# Patient Record
Sex: Female | Born: 1970
Health system: Southern US, Community
[De-identification: ages and names within clinical notes are randomized; demographics above are authoritative.]

## PROBLEM LIST (undated history)

## (undated) DIAGNOSIS — Z87440 Personal history of urinary (tract) infections: Secondary | ICD-10-CM

## (undated) DIAGNOSIS — O99019 Anemia complicating pregnancy, unspecified trimester: Secondary | ICD-10-CM

## (undated) DIAGNOSIS — R1310 Dysphagia, unspecified: Secondary | ICD-10-CM

## (undated) DIAGNOSIS — K219 Gastro-esophageal reflux disease without esophagitis: Secondary | ICD-10-CM

## (undated) DIAGNOSIS — O169 Unspecified maternal hypertension, unspecified trimester: Secondary | ICD-10-CM

## (undated) HISTORY — DX: Unspecified maternal hypertension, unspecified trimester: O16.9

## (undated) HISTORY — PX: DILATION AND CURETTAGE OF UTERUS: SHX78

## (undated) HISTORY — DX: Personal history of urinary (tract) infections: Z87.440

## (undated) HISTORY — DX: Dysphagia, unspecified: R13.10

## (undated) HISTORY — DX: Anemia complicating pregnancy, unspecified trimester: O99.019

## (undated) HISTORY — PX: OTHER SURGICAL HISTORY: SHX169

## (undated) HISTORY — PX: ESOPHAGOGASTRODUODENOSCOPY ENDOSCOPY: SHX5814

## (undated) HISTORY — DX: Gastro-esophageal reflux disease without esophagitis: K21.9

---

## 2010-06-10 ENCOUNTER — Encounter: Payer: Self-pay | Admitting: Gastroenterology

## 2010-06-10 ENCOUNTER — Ambulatory Visit: Payer: Self-pay | Admitting: Gastroenterology

## 2010-06-10 ENCOUNTER — Ambulatory Visit (INDEPENDENT_AMBULATORY_CARE_PROVIDER_SITE_OTHER): Payer: 59 | Admitting: Gastroenterology

## 2010-06-10 VITALS — BP 122/74 | HR 68 | Ht 62.0 in | Wt 138.0 lb

## 2010-06-10 DIAGNOSIS — R1319 Other dysphagia: Secondary | ICD-10-CM

## 2010-06-10 NOTE — Patient Instructions (Signed)
You have been scheduled for a Barium Esophagram with tablet on 06/11/10 at 11:00am at [facility] Radiology department.  You have also been scheduled for a Upper Endoscopy with Savary.

## 2010-06-10 NOTE — Progress Notes (Signed)
History of Present Illness: This is a 40 year old female, wife of Dr. Hillis Range. She relates an 8 year history of intermittent solid food dysphagia he states symptoms occur infrequently, possibly every 2-3 months and she would occasionally have to vomit to relieve the obstruction. She has noted a slow and steady increase in the frequency of her dysphagia and now her symptoms occur approximately every other day and she induces vomiting every few weeks. She only notes problems with solids. No liquid dysphagia. She states she had mild heartburn during her pregnancies, but other than that has not had heartburn symptoms. She has no other gastrointestinal complaints. She denies painful swallowing, reflux symptoms, chest pain, nausea, vomiting, abdominal pain, melena, hematochezia, diarrhea, constipation, change in bowel habits or change in stool caliber.  Past Medical History  Diagnosis Date  . History of UTI   . Hypertension in pregnancy   . Anemia in pregnancy    Past Surgical History  Procedure Date  . Dilation and curettage of uterus     reports that she has never smoked. She has never used smokeless tobacco. She reports that she does not drink alcohol or use illicit drugs. family history includes Diabetes in her father and Kidney disease in her maternal grandfather and paternal grandfather.  There is no history of Colon cancer. No Known Allergies    No outpatient encounter prescriptions on file as of 06/10/2010.   Review of Systems: Pertinent positive and negative review of systems were noted in the above HPI section. All other review of systems were otherwise negative.  Physical Exam: General: Well developed , well nourished, no acute distress Head: Normocephalic and atraumatic Eyes:  sclerae anicteric, EOMI Ears: Normal auditory acuity Mouth: No deformity or lesions Neck: Supple, no masses or thyromegaly Lungs: Clear throughout to auscultation Heart: Regular rate and rhythm; no  murmurs, rubs or bruits Abdomen: Soft, non tender and non distended. No masses, hepatosplenomegaly or hernias noted. Normal Bowel sounds Musculoskeletal: Symmetrical with no gross deformities  Skin: No lesions on visible extremities Pulses:  Normal pulses noted Extremities: No clubbing, cyanosis, edema or deformities noted Neurological: Alert oriented x 4, grossly nonfocal Cervical Nodes:  No significant cervical adenopathy Inguinal Nodes: No significant inguinal adenopathy Psychological:  Alert and cooperative. Normal mood and affect  Assessment and Recommendations:  1. Progressively worsening solid dysphagia. Symptoms present for 8 years but have worsened to the point of dysphagia occurring approximately every other day. Rule out a peptic stricture, Schatzki ring, eosinophilic esophagitis and other disorders. Schedule a barium esophagram with tablet and an upper endoscopy with possible dilation. The risks, benefits, and alternatives to endoscopy with possible biopsy and possible dilation were discussed with the patient and they consent to proceed.

## 2010-06-11 ENCOUNTER — Ambulatory Visit (HOSPITAL_COMMUNITY)
Admission: RE | Admit: 2010-06-11 | Discharge: 2010-06-11 | Disposition: A | Discharge status: Home / Self Care | Payer: 59 | Source: Ambulatory Visit | Attending: Gastroenterology | Admitting: Gastroenterology

## 2010-06-11 DIAGNOSIS — R1319 Other dysphagia: Secondary | ICD-10-CM

## 2010-06-11 DIAGNOSIS — R1310 Dysphagia, unspecified: Secondary | ICD-10-CM | POA: Insufficient documentation

## 2010-06-11 DIAGNOSIS — R6889 Other general symptoms and signs: Secondary | ICD-10-CM | POA: Insufficient documentation

## 2010-06-12 ENCOUNTER — Encounter: Payer: Self-pay | Admitting: Internal Medicine

## 2010-06-15 ENCOUNTER — Ambulatory Visit (AMBULATORY_SURGERY_CENTER): Payer: 59 | Admitting: Gastroenterology

## 2010-06-15 ENCOUNTER — Telehealth: Payer: Self-pay

## 2010-06-15 ENCOUNTER — Encounter: Payer: Self-pay | Admitting: Gastroenterology

## 2010-06-15 VITALS — BP 136/79 | HR 79 | Temp 96.8°F | Resp 13 | Ht 62.0 in | Wt 137.0 lb

## 2010-06-15 DIAGNOSIS — R1310 Dysphagia, unspecified: Secondary | ICD-10-CM

## 2010-06-15 MED ORDER — SODIUM CHLORIDE 0.9 % IV SOLN: 500.0000 mL | INTRAVENOUS | Status: DC

## 2010-06-15 NOTE — Telephone Encounter (Signed)
Message copied by Darcey Nora on Mon Jun 15, 2010 10:53 AM ------      Message from: Claudette Head      Created: Fri Jun 12, 2010  6:33 PM       Normal study      EGD with dilation is next

## 2010-06-15 NOTE — Progress Notes (Signed)
See telephone note from 06/15/10 for further documentation

## 2010-06-15 NOTE — Telephone Encounter (Signed)
Patient had EGD done today.  She is aware of the results

## 2010-06-15 NOTE — Patient Instructions (Signed)
Your upper endoscopy was normal today.  Dr. Russella Dar did dilate your esophagus today, so you will follow the dilation diet today.  Copy of dilation diet given to your care partner.  If your difficulity swallowing dose not resolve, and the biopsies are not diagnostic, consider  esophageal manomerty.  Please call with any questions or concerns.

## 2010-06-15 NOTE — Telephone Encounter (Signed)
Left message for patient to call back  

## 2010-06-16 ENCOUNTER — Telehealth: Payer: Self-pay | Admitting: *Deleted

## 2010-06-16 NOTE — Telephone Encounter (Signed)
Left message for patient to call us if she needs anything.

## 2010-06-18 ENCOUNTER — Encounter: Payer: Self-pay | Admitting: Gastroenterology

## 2015-06-11 DIAGNOSIS — Z0000 Encounter for general adult medical examination without abnormal findings: Secondary | ICD-10-CM | POA: Diagnosis not present

## 2015-06-11 DIAGNOSIS — E784 Other hyperlipidemia: Secondary | ICD-10-CM | POA: Diagnosis not present

## 2015-06-13 DIAGNOSIS — I10 Essential (primary) hypertension: Secondary | ICD-10-CM | POA: Diagnosis not present

## 2015-06-13 DIAGNOSIS — Z0000 Encounter for general adult medical examination without abnormal findings: Secondary | ICD-10-CM | POA: Diagnosis not present

## 2015-06-13 DIAGNOSIS — R740 Nonspecific elevation of levels of transaminase and lactic acid dehydrogenase [LDH]: Secondary | ICD-10-CM | POA: Diagnosis not present

## 2015-06-13 DIAGNOSIS — Z8719 Personal history of other diseases of the digestive system: Secondary | ICD-10-CM | POA: Diagnosis not present

## 2015-06-13 DIAGNOSIS — R635 Abnormal weight gain: Secondary | ICD-10-CM | POA: Diagnosis not present

## 2015-06-13 DIAGNOSIS — K219 Gastro-esophageal reflux disease without esophagitis: Secondary | ICD-10-CM | POA: Diagnosis not present

## 2015-06-13 DIAGNOSIS — E28319 Asymptomatic premature menopause: Secondary | ICD-10-CM | POA: Diagnosis not present

## 2015-06-13 DIAGNOSIS — G43909 Migraine, unspecified, not intractable, without status migrainosus: Secondary | ICD-10-CM | POA: Diagnosis not present

## 2015-06-13 DIAGNOSIS — E784 Other hyperlipidemia: Secondary | ICD-10-CM | POA: Diagnosis not present

## 2015-09-24 ENCOUNTER — Encounter: Payer: Self-pay | Admitting: Gastroenterology

## 2015-09-25 DIAGNOSIS — Z309 Encounter for contraceptive management, unspecified: Secondary | ICD-10-CM | POA: Diagnosis not present

## 2015-09-25 DIAGNOSIS — N924 Excessive bleeding in the premenopausal period: Secondary | ICD-10-CM | POA: Diagnosis not present

## 2015-11-27 ENCOUNTER — Ambulatory Visit: Payer: 59 | Admitting: Gastroenterology

## 2015-12-02 DIAGNOSIS — Z30430 Encounter for insertion of intrauterine contraceptive device: Secondary | ICD-10-CM | POA: Diagnosis not present

## 2015-12-02 DIAGNOSIS — Z6829 Body mass index (BMI) 29.0-29.9, adult: Secondary | ICD-10-CM | POA: Diagnosis not present

## 2015-12-02 DIAGNOSIS — R87810 Cervical high risk human papillomavirus (HPV) DNA test positive: Secondary | ICD-10-CM | POA: Diagnosis not present

## 2015-12-02 DIAGNOSIS — Z01419 Encounter for gynecological examination (general) (routine) without abnormal findings: Secondary | ICD-10-CM | POA: Diagnosis not present

## 2016-01-23 DIAGNOSIS — Z1231 Encounter for screening mammogram for malignant neoplasm of breast: Secondary | ICD-10-CM | POA: Diagnosis not present

## 2016-01-23 DIAGNOSIS — Z30431 Encounter for routine checking of intrauterine contraceptive device: Secondary | ICD-10-CM | POA: Diagnosis not present

## 2016-04-21 DIAGNOSIS — J00 Acute nasopharyngitis [common cold]: Secondary | ICD-10-CM | POA: Diagnosis not present

## 2016-04-21 DIAGNOSIS — B349 Viral infection, unspecified: Secondary | ICD-10-CM | POA: Diagnosis not present

## 2016-05-07 ENCOUNTER — Ambulatory Visit (INDEPENDENT_AMBULATORY_CARE_PROVIDER_SITE_OTHER): Payer: 59 | Admitting: Gastroenterology

## 2016-05-07 ENCOUNTER — Encounter: Payer: Self-pay | Admitting: Gastroenterology

## 2016-05-07 ENCOUNTER — Encounter (INDEPENDENT_AMBULATORY_CARE_PROVIDER_SITE_OTHER): Payer: Self-pay

## 2016-05-07 VITALS — BP 110/70 | HR 88 | Ht 61.61 in | Wt 156.4 lb

## 2016-05-07 DIAGNOSIS — R1310 Dysphagia, unspecified: Secondary | ICD-10-CM

## 2016-05-07 NOTE — Patient Instructions (Signed)
You have been scheduled for an endoscopy. Please follow written instructions given to you at your visit today. If you use inhalers (even only as needed), please bring them with you on the day of your procedure. Your physician has requested that you go to www.startemmi.com and enter the access code given to you at your visit today. This web site gives a general overview about your procedure. However, you should still follow specific instructions given to you by our office regarding your preparation for the procedure.  Thank you for choosing me and [facility].  Malcolm T. Stark, Jr., MD., FACG  

## 2016-05-07 NOTE — Progress Notes (Signed)
    History of Present Illness: This is a 46 year old female self referred for the evaluation of dysphagia. Patient relates mild intermittent problems with solid food dysphagia for about the past 3 years. The symptoms have slightly increased in frequency over the past 3-4 months. She previously underwent barium esophagram and EGD with empiric dilation as outlined below. She states following the EGD with dilation in her dysphasia abated for about 3 years. Denies weight loss, abdominal pain, constipation, diarrhea, change in stool caliber, melena, hematochezia, nausea, vomiting, reflux symptoms, chest pain.  Barium esophagram 05/2010 IMPRESSION: Normal double contrast esophagram. When the patient swallowed the 13 mm barium sulfate tablet, the tablet passed through the esophagus into the stomach without evidence of stricture or area of significant narrowing.  EGD 05/2010:  Normal with 17 mm Savary dilation performed. Esophageal biopsies were normal.   Review of Systems: Pertinent positive and negative review of systems were noted in the above HPI section. All other review of systems were otherwise negative.  Current Medications, Allergies, Past Medical History, Past Surgical History, Family History and Social History were reviewed in [employer] record.  Physical Exam: General: Well developed, well nourished, no acute distress Head: Normocephalic and atraumatic Eyes:  sclerae anicteric, EOMI Ears: Normal auditory acuity Mouth: No deformity or lesions Neck: Supple, no masses or thyromegaly Lungs: Clear throughout to auscultation Heart: Regular rate and rhythm; no murmurs, rubs or bruits Abdomen: Soft, non tender and non distended. No masses, hepatosplenomegaly or hernias noted. Normal Bowel sounds Rectal: not done Musculoskeletal: Symmetrical with no gross deformities  Skin: No lesions on visible extremities Pulses:  Normal pulses noted Extremities: No clubbing,  cyanosis, edema or deformities noted Neurological: Alert oriented x 4, grossly nonfocal Cervical Nodes:  No significant cervical adenopathy Inguinal Nodes: No significant inguinal adenopathy Psychological:  Alert and cooperative. Normal mood and affect  Assessment and Recommendations:  1. Dysphagia. Etiology unclear but suspect a subtle esophageal stricture or a motility disorder as most likely possibilities. Schedule EGD with repeat dilation. If symptoms do not respond will discuss esophageal manometry. The risks (including bleeding, perforation, infection, missed lesions, medication reactions and possible hospitalization or surgery if complications occur), benefits, and alternatives to endoscopy with possible biopsy and possible dilation were discussed with the patient and they consent to proceed.

## 2016-06-04 ENCOUNTER — Encounter: Payer: Self-pay | Admitting: Gastroenterology

## 2016-06-18 ENCOUNTER — Encounter: Payer: Self-pay | Admitting: Gastroenterology

## 2016-06-18 ENCOUNTER — Ambulatory Visit (AMBULATORY_SURGERY_CENTER): Payer: 59 | Admitting: Gastroenterology

## 2016-06-18 VITALS — BP 119/71 | HR 75 | Temp 98.9°F | Resp 15 | Ht 61.0 in | Wt 156.0 lb

## 2016-06-18 DIAGNOSIS — R1310 Dysphagia, unspecified: Secondary | ICD-10-CM | POA: Diagnosis not present

## 2016-06-18 DIAGNOSIS — K222 Esophageal obstruction: Secondary | ICD-10-CM

## 2016-06-18 DIAGNOSIS — K228 Other specified diseases of esophagus: Secondary | ICD-10-CM | POA: Diagnosis not present

## 2016-06-18 DIAGNOSIS — I10 Essential (primary) hypertension: Secondary | ICD-10-CM | POA: Diagnosis not present

## 2016-06-18 DIAGNOSIS — E669 Obesity, unspecified: Secondary | ICD-10-CM | POA: Diagnosis not present

## 2016-06-18 MED ORDER — SODIUM CHLORIDE 0.9 % IV SOLN: 500.0000 mL | INTRAVENOUS | Status: DC

## 2016-06-18 NOTE — Op Note (Signed)
[place] Patient Name: Phyllis Rogers Procedure Date: 06/18/2016 9:38 AM MRN: [id number] Endoscopist: Ladene Artist , MD Age: 46 Referring MD:  Date of Birth: 1970-02-27 Gender: Female Account #: [id number] Procedure:                Upper GI endoscopy Indications:              Dysphagia Medicines:                Monitored Anesthesia Care Procedure:                Pre-Anesthesia Assessment:                           - Prior to the procedure, a History and Physical                            was performed, and patient medications and                            allergies were reviewed. The patient's tolerance of                            previous anesthesia was also reviewed. The risks                            and benefits of the procedure and the sedation                            options and risks were discussed with the patient.                            All questions were answered, and informed consent                            was obtained. Prior Anticoagulants: The patient has                            taken no previous anticoagulant or antiplatelet                            agents. ASA Grade Assessment: II - A patient with                            mild systemic disease. After reviewing the risks                            and benefits, the patient was deemed in                            satisfactory condition to undergo the procedure.                           After obtaining informed consent, the endoscope was  passed under direct vision. Throughout the                            procedure, the patient's blood pressure, pulse, and                            oxygen saturations were monitored continuously. The                            Endoscope was introduced through the mouth, and                            advanced to the second part of duodenum. The upper                            GI endoscopy was accomplished without  difficulty.                            The patient tolerated the procedure well. Scope In: Scope Out: Findings:                 One moderate benign-appearing, intrinsic stenosis                            was found at the gastroesophageal junction. This                            measured 1.3 cm (inner diameter) and was traversed.                            A guidewire was placed and the scope was withdrawn.                            Dilation was performed with a Savary dilator with                            no resistance at 13 mm. Estimated blood loss: none.                            A guidewire was placed and the scope was withdrawn.                            Dilations were performed with a Savary dilator with                            mild resistance at 14 mm, 15 mm and 16 mm.                            Estimated blood loss was minimal.                           The exam of the esophagus was otherwise normal.  Biopsies obtained in mid and distal esophagus to                            r/o esophagitis.                           The entire examined stomach was normal.                           The duodenal bulb and second portion of the                            duodenum were normal. Complications:            No immediate complications. Estimated Blood Loss:     Estimated blood loss was minimal. Impression:               - Benign-appearing esophageal stenosis. Dilated.                           - Normal stomach.                           - Normal duodenal bulb and second portion of the                            duodenum.                           - No specimens collected. Recommendation:           - Patient has a contact number available for                            emergencies. The signs and symptoms of potential                            delayed complications were discussed with the                            patient. Return to normal activities  tomorrow.                            Written discharge instructions were provided to the                            patient.                           - Clear liquid diet for 2 hours, then advance as                            tolerated to soft diet today. Resume prior diet                            tomorrow.                           -  Continue present medications.                           - Await pathology results. Ladene Artist, MD 06/18/2016 10:00:49 AM This report has been signed electronically.

## 2016-06-18 NOTE — Progress Notes (Signed)
Report to PACU, RN, vss, BBS= Clear.  

## 2016-06-18 NOTE — Progress Notes (Signed)
Called to room to assist during endoscopic procedure.  Patient ID and intended procedure confirmed with present staff. Received instructions for my participation in the procedure from the performing physician.  

## 2016-06-18 NOTE — Patient Instructions (Addendum)
Impression/Recommendations:  Clear liquid diet for 2 hours (until 12 noon).   Then advance as tolerated to soft diet today. Resume prior diet tomorrow.  Continue present medications. Await pathology results.  YOU HAD AN ENDOSCOPIC PROCEDURE TODAY AT [facility]:   Refer to the procedure report that was given to you for any specific questions about what was found during the examination.  If the procedure report does not answer your questions, please call your gastroenterologist to clarify.  If you requested that your care partner not be given the details of your procedure findings, then the procedure report has been included in a sealed envelope for you to review at your convenience later.  YOU SHOULD EXPECT: Some feelings of bloating in the abdomen. Passage of more gas than usual.  Walking can help get rid of the air that was put into your GI tract during the procedure and reduce the bloating. If you had a lower endoscopy (such as a colonoscopy or flexible sigmoidoscopy) you may notice spotting of blood in your stool or on the toilet paper. If you underwent a bowel prep for your procedure, you may not have a normal bowel movement for a few days.  Please Note:  You might notice some irritation and congestion in your nose or some drainage.  This is from the oxygen used during your procedure.  There is no need for concern and it should clear up in a day or so.  SYMPTOMS TO REPORT IMMEDIATELY:   Following upper endoscopy (EGD)  Vomiting of blood or coffee ground material  New chest pain or pain under the shoulder blades  Painful or persistently difficult swallowing  New shortness of breath  Fever of 100F or higher  Black, tarry-looking stools  For urgent or emergent issues, a gastroenterologist can be reached at any hour by calling [phone].   DIET:  We do recommend a small meal at first, but then you may proceed to your regular diet.  Drink plenty of fluids but you  should avoid alcoholic beverages for 24 hours.  ACTIVITY:  You should plan to take it easy for the rest of today and you should NOT DRIVE or use heavy machinery until tomorrow (because of the sedation medicines used during the test).    FOLLOW UP: Our staff will call the number listed on your records the next business day following your procedure to check on you and address any questions or concerns that you may have regarding the information given to you following your procedure. If we do not reach you, we will leave a message.  However, if you are feeling well and you are not experiencing any problems, there is no need to return our call.  We will assume that you have returned to your regular daily activities without incident.  If any biopsies were taken you will be contacted by phone or by letter within the next 1-3 weeks.  Please call us at [phone] if you have not heard about the biopsies in 3 weeks.    SIGNATURES/CONFIDENTIALITY: You and/or your care partner have signed paperwork which will be entered into your electronic medical record.  These signatures attest to the fact that that the information above on your After Visit Summary has been reviewed and is understood.  Full responsibility of the confidentiality of this discharge information lies with you and/or your care-partner.

## 2016-06-21 ENCOUNTER — Telehealth: Payer: Self-pay

## 2016-06-21 NOTE — Telephone Encounter (Signed)
  Follow up Call-  Call back number 06/18/2016  Post procedure Call Back phone  # [phone]  Permission to leave phone message Yes  Some recent data might be hidden     Patient questions:  Do you have a fever, pain , or abdominal swelling? No. Pain Score  0 *  Have you tolerated food without any problems? Yes.    Have you been able to return to your normal activities? Yes.    Do you have any questions about your discharge instructions: Diet   No. Medications  No. Follow up visit  No.  Do you have questions or concerns about your Care? No.  Actions: * If pain score is 4 or above: No action needed, pain <4.  No problems noted per pt. maw

## 2016-07-01 ENCOUNTER — Encounter: Payer: Self-pay | Admitting: Gastroenterology

## 2016-07-08 ENCOUNTER — Telehealth: Payer: Self-pay | Admitting: Gastroenterology

## 2016-07-08 NOTE — Telephone Encounter (Addendum)
Patient reports that she is still having dysphagia.  She reports that with first dilation she had a few years ago that after dilation she instantly felt better.  She asks if she needs a repeat or will this continue to improve over time.  She also noted that she has had new symptoms of "heartburn" since the endoscopy.  She did switch from decaf coffee to regular a few weeks ago.  We discussed an antireflux diet and she will add an OTC PPI until Dr. Fuller Plan can review and advise.  She is aware that you are out of the office until Monday.

## 2016-07-08 NOTE — Telephone Encounter (Signed)
Left message for patient to call back  

## 2016-07-11 NOTE — Telephone Encounter (Signed)
Sometimes the strictures do not respond as well as other times. Sometimes a larger diameter dilation is needed to improve swallowing. Her stricture was more severe this time. Schedule repeat EGD with dilation. As GERD is the likely etiology she should follow standard antireflux measures long term and take a PPI qam long term. Nexium 20 mg OTC qam or Prilosec 20 mg OTC qam.

## 2016-07-12 NOTE — Telephone Encounter (Signed)
Mailbox is full.  I will try and call her later

## 2016-07-13 NOTE — Telephone Encounter (Signed)
Left message for patient to call back  

## 2016-07-13 NOTE — Telephone Encounter (Signed)
Patient notified of the recommendations She wants to think about the EGD.  She will call back if she wants to repeat

## 2017-02-23 ENCOUNTER — Encounter: Payer: Self-pay | Admitting: Gastroenterology

## 2017-02-23 ENCOUNTER — Encounter (INDEPENDENT_AMBULATORY_CARE_PROVIDER_SITE_OTHER): Payer: Self-pay

## 2017-02-23 ENCOUNTER — Ambulatory Visit: Payer: 59 | Admitting: Gastroenterology

## 2017-02-23 VITALS — BP 130/84 | HR 88 | Ht 61.61 in | Wt 165.0 lb

## 2017-02-23 DIAGNOSIS — R1310 Dysphagia, unspecified: Secondary | ICD-10-CM | POA: Diagnosis not present

## 2017-02-23 DIAGNOSIS — R1319 Other dysphagia: Secondary | ICD-10-CM

## 2017-02-23 DIAGNOSIS — K219 Gastro-esophageal reflux disease without esophagitis: Secondary | ICD-10-CM | POA: Diagnosis not present

## 2017-02-23 MED ORDER — OMEPRAZOLE 20 MG PO CPDR: 20.0000 mg | DELAYED_RELEASE_CAPSULE | Freq: Every day | ORAL | 11 refills | Status: DC

## 2017-02-23 NOTE — Patient Instructions (Signed)
We have sent the following medications to your pharmacy for you to pick up at your convenience: omeprazole.   You have been scheduled for an endoscopy. Please follow written instructions given to you at your visit today. If you use inhalers (even only as needed), please bring them with you on the day of your procedure. Your physician has requested that you go to www.startemmi.com and enter the access code given to you at your visit today. This web site gives a general overview about your procedure. However, you should still follow specific instructions given to you by our office regarding your preparation for the procedure.  Normal BMI (Body Mass Index- based on height and weight) is between 19 and 25. Your BMI today is Body mass index is 30.56 kg/m. Marland Kitchen Please consider follow up  regarding your BMI with your Primary Care Provider.  Thank you for choosing me and [facility].  Pricilla Riffle. Dagoberto Ligas., MD., Marval Regal

## 2017-02-23 NOTE — Progress Notes (Signed)
    History of Present Illness: This is a 47 year old female with a history of an esophageal stricture complaining of persistent dysphasia.  EGD performed in 2012 led to prompt relief of her dysphagia for several years.  She notes that she had no relief of her dysphasia following her EGD with dilation in April as outlined below.  She states she has mild heartburn symptoms about once per month that responds quickly to as needed Zantac or Tums.  No other gastrointestinal complaints.  She is not taking a PPI on a regular basis.  EGD 05/2016 - Benign-appearing esophageal stenosis. Dilated to 16 mm Savary. - Normal stomach. - Normal duodenal bulb and second portion of the duodenum.  Current Medications, Allergies, Past Medical History, Past Surgical History, Family History and Social History were reviewed in [employer] record.  Physical Exam: General: Well developed, well nourished, no acute distress Head: Normocephalic and atraumatic Eyes:  sclerae anicteric, EOMI Ears: Normal auditory acuity Mouth: No deformity or lesions Lungs: Clear throughout to auscultation Heart: Regular rate and rhythm; no murmurs, rubs or bruits Abdomen: Soft, non tender and non distended. No masses, hepatosplenomegaly or hernias noted. Normal Bowel sounds Musculoskeletal: Symmetrical with no gross deformities  Pulses:  Normal pulses noted Extremities: No clubbing, cyanosis, edema or deformities noted Neurological: Alert oriented x 4, grossly nonfocal Psychological:  Alert and cooperative. Normal mood and affect  Assessment and Recommendations:  1.  Dysphagia. Infrequent mild heartburn.  Suspected persistent esophageal stricture.  Unusual that she did not respond to her last dilation.  Biopsies in 2012 did not reveal we discussed that reflux is likely evidence of eosinophilic esophagitis or other disorder.  We discussed that reflux is likely leading to her recurrent stricture.  Recommended  beginning omeprazole 20 mg daily on a long-term basis.  Recommended proceeding with EGD with dilation.  She prefers to schedule her EGD in March and she is advised to call if her symptoms worsen. The risks (including bleeding, perforation, infection, missed lesions, medication reactions and possible hospitalization or surgery if complications occur), benefits, and alternatives to endoscopy with possible biopsy and possible dilation were discussed with the patient and they consent to proceed.   2.  CRC screening, average.  Colonoscopy at age 43.

## 2017-04-08 DIAGNOSIS — I10 Essential (primary) hypertension: Secondary | ICD-10-CM | POA: Diagnosis not present

## 2017-04-08 DIAGNOSIS — R82998 Other abnormal findings in urine: Secondary | ICD-10-CM | POA: Diagnosis not present

## 2017-04-08 DIAGNOSIS — E7849 Other hyperlipidemia: Secondary | ICD-10-CM | POA: Diagnosis not present

## 2017-04-13 ENCOUNTER — Encounter: Payer: Self-pay | Admitting: Gastroenterology

## 2017-04-15 DIAGNOSIS — R1310 Dysphagia, unspecified: Secondary | ICD-10-CM | POA: Diagnosis not present

## 2017-04-15 DIAGNOSIS — E7849 Other hyperlipidemia: Secondary | ICD-10-CM | POA: Diagnosis not present

## 2017-04-15 DIAGNOSIS — Z8719 Personal history of other diseases of the digestive system: Secondary | ICD-10-CM | POA: Diagnosis not present

## 2017-04-15 DIAGNOSIS — E668 Other obesity: Secondary | ICD-10-CM | POA: Diagnosis not present

## 2017-04-15 DIAGNOSIS — Z1389 Encounter for screening for other disorder: Secondary | ICD-10-CM | POA: Diagnosis not present

## 2017-04-15 DIAGNOSIS — K219 Gastro-esophageal reflux disease without esophagitis: Secondary | ICD-10-CM | POA: Diagnosis not present

## 2017-04-15 DIAGNOSIS — I10 Essential (primary) hypertension: Secondary | ICD-10-CM | POA: Diagnosis not present

## 2017-04-15 DIAGNOSIS — Z23 Encounter for immunization: Secondary | ICD-10-CM | POA: Diagnosis not present

## 2017-04-15 DIAGNOSIS — Z0000 Encounter for general adult medical examination without abnormal findings: Secondary | ICD-10-CM | POA: Diagnosis not present

## 2017-04-15 DIAGNOSIS — E28319 Asymptomatic premature menopause: Secondary | ICD-10-CM | POA: Diagnosis not present

## 2017-04-22 ENCOUNTER — Other Ambulatory Visit: Payer: Self-pay

## 2017-04-22 ENCOUNTER — Encounter: Payer: Self-pay | Admitting: Gastroenterology

## 2017-04-22 ENCOUNTER — Ambulatory Visit (AMBULATORY_SURGERY_CENTER): Payer: 59 | Admitting: Gastroenterology

## 2017-04-22 VITALS — BP 119/64 | HR 69 | Temp 97.8°F | Resp 16 | Ht 61.0 in | Wt 165.0 lb

## 2017-04-22 DIAGNOSIS — I10 Essential (primary) hypertension: Secondary | ICD-10-CM | POA: Diagnosis not present

## 2017-04-22 DIAGNOSIS — R1310 Dysphagia, unspecified: Secondary | ICD-10-CM | POA: Diagnosis present

## 2017-04-22 DIAGNOSIS — R1319 Other dysphagia: Secondary | ICD-10-CM

## 2017-04-22 DIAGNOSIS — K222 Esophageal obstruction: Secondary | ICD-10-CM | POA: Diagnosis not present

## 2017-04-22 DIAGNOSIS — K219 Gastro-esophageal reflux disease without esophagitis: Secondary | ICD-10-CM | POA: Diagnosis not present

## 2017-04-22 MED ORDER — SODIUM CHLORIDE 0.9 % IV SOLN: 500.0000 mL | Freq: Once | INTRAVENOUS | Status: DC

## 2017-04-22 NOTE — Op Note (Signed)
[facility] Patient Name: Phyllis Rogers Procedure Date: 04/22/2017 9:04 AM MRN: [id number] Endoscopist: Ladene Artist , MD Age: 47 Referring MD:  Date of Birth: 13-Apr-1970 Gender: Female Account #: [id number] Procedure:                Upper GI endoscopy Indications:              Dysphagia Medicines:                Monitored Anesthesia Care Procedure:                Pre-Anesthesia Assessment:                           - Prior to the procedure, a History and Physical                            was performed, and patient medications and                            allergies were reviewed. The patient's tolerance of                            previous anesthesia was also reviewed. The risks                            and benefits of the procedure and the sedation                            options and risks were discussed with the patient.                            All questions were answered, and informed consent                            was obtained. Prior Anticoagulants: The patient has                            taken no previous anticoagulant or antiplatelet                            agents. ASA Grade Assessment: II - A patient with                            mild systemic disease. After reviewing the risks                            and benefits, the patient was deemed in                            satisfactory condition to undergo the procedure.                           After obtaining informed consent, the endoscope was  passed under direct vision. Throughout the                            procedure, the patient's blood pressure, pulse, and                            oxygen saturations were monitored continuously. The                            Endoscope was introduced through the mouth, and                            advanced to the second part of duodenum. The upper                            GI endoscopy was accomplished without  difficulty.                            The patient tolerated the procedure well. Scope In: Scope Out: Findings:                 One mild benign-appearing, intrinsic stenosis was                            found at the gastroesophageal junction. This                            measured 1.4 cm (inner diameter) and was traversed.                            A guidewire was placed and the scope was withdrawn.                            Dilations were performed with Savary dilators with                            mild resistance at 15 mm, 16 mm and 17 mm.                            Estimated blood loss: none.                           The exam of the esophagus was otherwise normal.                           The entire examined stomach was normal.                           The duodenal bulb and second portion of the                            duodenum were normal. Complications:            No immediate complications. Estimated Blood Loss:     Estimated blood loss: none. Impression:               -  Benign-appearing esophageal stenosis. Dilated.                           - Normal stomach.                           - Normal duodenal bulb and second portion of the                            duodenum.                           - No specimens collected. Recommendation:           - Patient has a contact number available for                            emergencies. The signs and symptoms of potential                            delayed complications were discussed with the                            patient. Return to normal activities tomorrow.                            Written discharge instructions were provided to the                            patient.                           - Clear liquid diet for 2 hours, then advance as                            tolerated to soft diet today.                           - Follow antireflux measures.                           - Continue present medications  including omeprazole                            20 mg po qam long term.                           - Return to GI office in 2 months. Ladene Artist, MD 04/22/2017 9:20:17 AM This report has been signed electronically.

## 2017-04-22 NOTE — Progress Notes (Signed)
Report to PACU, RN, vss, BBS= Clear.  

## 2017-04-22 NOTE — Progress Notes (Signed)
Called to room to assist during endoscopic procedure.  Patient ID and intended procedure confirmed with present staff. Received instructions for my participation in the procedure from the performing physician.  

## 2017-04-22 NOTE — Patient Instructions (Signed)
CONTINUE YOUR OMEPRAZOLE EVERY AM , 20-30 MINUTES PRIOR TO MORNING MEAL.  HANDOUT GIVEN FOR ESOPHAGEAL DILATATION DIET AND STRICTURE.  YOU HAD AN ENDOSCOPIC PROCEDURE TODAY AT [facility]:   Refer to the procedure report that was given to you for any specific questions about what was found during the examination.  If the procedure report does not answer your questions, please call your gastroenterologist to clarify.  If you requested that your care partner not be given the details of your procedure findings, then the procedure report has been included in a sealed envelope for you to review at your convenience later.  YOU SHOULD EXPECT: Some feelings of bloating in the abdomen. Passage of more gas than usual.  Walking can help get rid of the air that was put into your GI tract during the procedure and reduce the bloating. If you had a lower endoscopy (such as a colonoscopy or flexible sigmoidoscopy) you may notice spotting of blood in your stool or on the toilet paper. If you underwent a bowel prep for your procedure, you may not have a normal bowel movement for a few days.  Please Note:  You might notice some irritation and congestion in your nose or some drainage.  This is from the oxygen used during your procedure.  There is no need for concern and it should clear up in a day or so.  SYMPTOMS TO REPORT IMMEDIATELY:    Following upper endoscopy (EGD)  Vomiting of blood or coffee ground material  New chest pain or pain under the shoulder blades  Painful or persistently difficult swallowing  New shortness of breath  Fever of 100F or higher  Black, tarry-looking stools  For urgent or emergent issues, a gastroenterologist can be reached at any hour by calling [phone].   DIET: CLEAR LIQUIDS FOR 2 HOURS , UNTIL11:30 . AFTER 11:30 UNTIL MORNING ONLY SOFT FOODS.  ACTIVITY:  You should plan to take it easy for the rest of today and you should NOT DRIVE or use heavy  machinery until tomorrow (because of the sedation medicines used during the test).    FOLLOW UP: Our staff will call the number listed on your records the next business day following your procedure to check on you and address any questions or concerns that you may have regarding the information given to you following your procedure. If we do not reach you, we will leave a message.  However, if you are feeling well and you are not experiencing any problems, there is no need to return our call.  We will assume that you have returned to your regular daily activities without incident.  If any biopsies were taken you will be contacted by phone or by letter within the next 1-3 weeks.  Please call us at [phone] if you have not heard about the biopsies in 3 weeks.    SIGNATURES/CONFIDENTIALITY: You and/or your care partner have signed paperwork which will be entered into your electronic medical record.  These signatures attest to the fact that that the information above on your After Visit Summary has been reviewed and is understood.  Full responsibility of the confidentiality of this discharge information lies with you and/or your care-partner.

## 2017-04-25 ENCOUNTER — Telehealth: Payer: Self-pay | Admitting: *Deleted

## 2017-04-25 NOTE — Telephone Encounter (Signed)
  Follow up Call-  Call back number 04/22/2017 06/18/2016  Post procedure Call Back phone  # [phone]  Permission to leave phone message Yes Yes  Some recent data might be hidden     Patient questions:  Do you have a fever, pain , or abdominal swelling? No. Pain Score  0 *  Have you tolerated food without any problems? Yes.    Have you been able to return to your normal activities? Yes.    Do you have any questions about your discharge instructions: Diet   No. Medications  No. Follow up visit  No.  Do you have questions or concerns about your Care? No.  Actions: * If pain score is 4 or above: No action needed, pain <4.

## 2017-07-07 DIAGNOSIS — F902 Attention-deficit hyperactivity disorder, combined type: Secondary | ICD-10-CM | POA: Diagnosis not present

## 2017-07-07 DIAGNOSIS — Z79899 Other long term (current) drug therapy: Secondary | ICD-10-CM | POA: Diagnosis not present

## 2017-07-07 DIAGNOSIS — F419 Anxiety disorder, unspecified: Secondary | ICD-10-CM | POA: Diagnosis not present

## 2017-07-07 DIAGNOSIS — F338 Other recurrent depressive disorders: Secondary | ICD-10-CM | POA: Diagnosis not present

## 2017-07-07 DIAGNOSIS — F429 Obsessive-compulsive disorder, unspecified: Secondary | ICD-10-CM | POA: Diagnosis not present

## 2017-07-07 DIAGNOSIS — R41840 Attention and concentration deficit: Secondary | ICD-10-CM | POA: Diagnosis not present

## 2017-07-07 DIAGNOSIS — H93299 Other abnormal auditory perceptions, unspecified ear: Secondary | ICD-10-CM | POA: Diagnosis not present

## 2017-07-07 DIAGNOSIS — F4010 Social phobia, unspecified: Secondary | ICD-10-CM | POA: Diagnosis not present

## 2017-08-11 DIAGNOSIS — H93299 Other abnormal auditory perceptions, unspecified ear: Secondary | ICD-10-CM | POA: Diagnosis not present

## 2017-08-11 DIAGNOSIS — Z79899 Other long term (current) drug therapy: Secondary | ICD-10-CM | POA: Diagnosis not present

## 2017-08-11 DIAGNOSIS — F902 Attention-deficit hyperactivity disorder, combined type: Secondary | ICD-10-CM | POA: Diagnosis not present

## 2017-08-11 DIAGNOSIS — F419 Anxiety disorder, unspecified: Secondary | ICD-10-CM | POA: Diagnosis not present

## 2017-09-20 MED FILL — VYVANSE 30 MG CAPSULE: 30 | 30 days supply | Qty: 30 | Fill #0

## 2017-10-20 MED FILL — VYVANSE 30 MG CAPSULE: 30 | 30 days supply | Qty: 30 | Fill #0

## 2017-11-11 DIAGNOSIS — Z79899 Other long term (current) drug therapy: Secondary | ICD-10-CM | POA: Diagnosis not present

## 2017-11-11 DIAGNOSIS — F419 Anxiety disorder, unspecified: Secondary | ICD-10-CM | POA: Diagnosis not present

## 2017-11-11 DIAGNOSIS — F902 Attention-deficit hyperactivity disorder, combined type: Secondary | ICD-10-CM | POA: Diagnosis not present

## 2017-11-18 MED FILL — VYVANSE 30 MG CAPSULE: 30 | 30 days supply | Qty: 30 | Fill #0

## 2017-12-28 MED FILL — VYVANSE 30 MG CAPSULE: 30 | 30 days supply | Qty: 30 | Fill #0

## 2018-01-11 DIAGNOSIS — M79644 Pain in right finger(s): Secondary | ICD-10-CM | POA: Diagnosis not present

## 2018-02-08 MED FILL — VYVANSE 20 MG CAPSULE: 20 | 30 days supply | Qty: 30 | Fill #0

## 2018-03-16 DIAGNOSIS — F419 Anxiety disorder, unspecified: Secondary | ICD-10-CM | POA: Diagnosis not present

## 2018-03-16 DIAGNOSIS — Z79899 Other long term (current) drug therapy: Secondary | ICD-10-CM | POA: Diagnosis not present

## 2018-03-16 DIAGNOSIS — F902 Attention-deficit hyperactivity disorder, combined type: Secondary | ICD-10-CM | POA: Diagnosis not present

## 2018-03-16 DIAGNOSIS — H93299 Other abnormal auditory perceptions, unspecified ear: Secondary | ICD-10-CM | POA: Diagnosis not present

## 2018-03-16 MED FILL — VYVANSE 20 MG CAPSULE: 20 | 30 days supply | Qty: 30 | Fill #0

## 2018-04-18 DIAGNOSIS — I10 Essential (primary) hypertension: Secondary | ICD-10-CM | POA: Diagnosis not present

## 2018-04-18 DIAGNOSIS — Z0000 Encounter for general adult medical examination without abnormal findings: Secondary | ICD-10-CM | POA: Diagnosis not present

## 2018-04-18 DIAGNOSIS — R82998 Other abnormal findings in urine: Secondary | ICD-10-CM | POA: Diagnosis not present

## 2018-04-19 MED FILL — VYVANSE 20 MG CAPSULE: 20 | 30 days supply | Qty: 30 | Fill #0

## 2018-04-25 DIAGNOSIS — Z23 Encounter for immunization: Secondary | ICD-10-CM | POA: Diagnosis not present

## 2018-04-25 DIAGNOSIS — I10 Essential (primary) hypertension: Secondary | ICD-10-CM | POA: Diagnosis not present

## 2018-04-25 DIAGNOSIS — Z8719 Personal history of other diseases of the digestive system: Secondary | ICD-10-CM | POA: Diagnosis not present

## 2018-04-25 DIAGNOSIS — G43909 Migraine, unspecified, not intractable, without status migrainosus: Secondary | ICD-10-CM | POA: Diagnosis not present

## 2018-04-25 DIAGNOSIS — Z1339 Encounter for screening examination for other mental health and behavioral disorders: Secondary | ICD-10-CM | POA: Diagnosis not present

## 2018-04-25 DIAGNOSIS — E7849 Other hyperlipidemia: Secondary | ICD-10-CM | POA: Diagnosis not present

## 2018-04-25 DIAGNOSIS — K219 Gastro-esophageal reflux disease without esophagitis: Secondary | ICD-10-CM | POA: Diagnosis not present

## 2018-04-25 DIAGNOSIS — Z0000 Encounter for general adult medical examination without abnormal findings: Secondary | ICD-10-CM | POA: Diagnosis not present

## 2018-04-25 DIAGNOSIS — J069 Acute upper respiratory infection, unspecified: Secondary | ICD-10-CM | POA: Diagnosis not present

## 2018-04-25 DIAGNOSIS — Z1331 Encounter for screening for depression: Secondary | ICD-10-CM | POA: Diagnosis not present

## 2018-04-25 DIAGNOSIS — E28319 Asymptomatic premature menopause: Secondary | ICD-10-CM | POA: Diagnosis not present

## 2018-04-25 DIAGNOSIS — F900 Attention-deficit hyperactivity disorder, predominantly inattentive type: Secondary | ICD-10-CM | POA: Diagnosis not present

## 2018-06-01 MED FILL — VYVANSE 20 MG CAPSULE: 20 | 30 days supply | Qty: 30 | Fill #0

## 2018-07-05 MED FILL — VYVANSE 20 MG CAPSULE: 20 | 30 days supply | Qty: 30 | Fill #0

## 2018-07-06 ENCOUNTER — Encounter: Payer: Self-pay | Admitting: Obstetrics & Gynecology

## 2018-07-12 DIAGNOSIS — F902 Attention-deficit hyperactivity disorder, combined type: Secondary | ICD-10-CM | POA: Diagnosis not present

## 2018-07-12 DIAGNOSIS — F419 Anxiety disorder, unspecified: Secondary | ICD-10-CM | POA: Diagnosis not present

## 2018-07-12 DIAGNOSIS — Z79899 Other long term (current) drug therapy: Secondary | ICD-10-CM | POA: Diagnosis not present

## 2018-08-02 MED FILL — VYVANSE 20 MG CAPSULE: 20 | 30 days supply | Qty: 30 | Fill #0

## 2018-09-04 ENCOUNTER — Other Ambulatory Visit: Payer: Self-pay

## 2018-09-05 ENCOUNTER — Encounter: Payer: Self-pay | Admitting: Obstetrics & Gynecology

## 2018-09-05 ENCOUNTER — Ambulatory Visit (INDEPENDENT_AMBULATORY_CARE_PROVIDER_SITE_OTHER): Payer: 59 | Admitting: Obstetrics & Gynecology

## 2018-09-05 VITALS — BP 126/84 | Ht 62.75 in | Wt 165.0 lb

## 2018-09-05 DIAGNOSIS — Z30431 Encounter for routine checking of intrauterine contraceptive device: Secondary | ICD-10-CM | POA: Diagnosis not present

## 2018-09-05 DIAGNOSIS — Z01419 Encounter for gynecological examination (general) (routine) without abnormal findings: Secondary | ICD-10-CM

## 2018-09-05 DIAGNOSIS — Z780 Asymptomatic menopausal state: Secondary | ICD-10-CM | POA: Diagnosis not present

## 2018-09-05 DIAGNOSIS — E663 Overweight: Secondary | ICD-10-CM

## 2018-09-05 NOTE — Progress Notes (Signed)
Phyllis Rogers 01-May-1970 [id number]   History:    48 y.o. [id number]  Married  RP:  Established patient presenting for annual gyn exam   HPI: Early menopause, well on no HRT, but on Mirena IUD as a precaution x 2018.  No PMB.  No pelvic pain.  No pain with IC.  Urine/BMs normal.  Breasts normal.  BMI 29.46.  Good fitness.  Health labs with Fam MD.  Past medical history,surgical history, family history and social history were all reviewed and documented in the EPIC chart.  Gynecologic History Patient's last menstrual period was 06/04/2010. Contraception: Mirena IUD x 2018 Last Pap: Probably 2017-2018 Normal per patient, will obtain from [facility]. Last mammogram: Overdue for a screening Mammo, will schedule at [facility]. Bone Density: Never Colonoscopy: Never  Obstetric History OB History  Gravida Para Term Preterm AB Living  5 3     2 4   SAB TAB Ectopic Multiple Live Births  2            # Outcome Date GA Lbr Len/2nd Weight Sex Delivery Anes PTL Lv  5 SAB           4 SAB           3 Para           2 Para           1 Para             Obstetric Comments  1 adopted boy     ROS: A ROS was performed and pertinent positives and negatives are included in the history.  GENERAL: No fevers or chills. HEENT: No change in vision, no earache, sore throat or sinus congestion. NECK: No pain or stiffness. CARDIOVASCULAR: No chest pain or pressure. No palpitations. PULMONARY: No shortness of breath, cough or wheeze. GASTROINTESTINAL: No abdominal pain, nausea, vomiting or diarrhea, melena or bright red blood per rectum. GENITOURINARY: No urinary frequency, urgency, hesitancy or dysuria. MUSCULOSKELETAL: No joint or muscle pain, no back pain, no recent trauma. DERMATOLOGIC: No rash, no itching, no lesions. ENDOCRINE: No polyuria, polydipsia, no heat or cold intolerance. No recent change in weight. HEMATOLOGICAL: No anemia or easy bruising or bleeding. NEUROLOGIC: No headache,  seizures, numbness, tingling or weakness. PSYCHIATRIC: No depression, no loss of interest in normal activity or change in sleep pattern.     Exam:   BP 126/84   Ht 5' 2.75" (1.594 m)   Wt 165 lb (74.8 kg)   LMP 06/04/2010 Comment: [email or web address]  BMI 29.46 kg/m   Body mass index is 29.46 kg/m.  General appearance : Well developed well nourished female. No acute distress HEENT: Eyes: no retinal hemorrhage or exudates,  Neck supple, trachea midline, no carotid bruits, no thyroidmegaly Lungs: Clear to auscultation, no rhonchi or wheezes, or rib retractions  Heart: Regular rate and rhythm, no murmurs or gallops Breast:Examined in sitting and supine position were symmetrical in appearance, no palpable masses or tenderness,  no skin retraction, no nipple inversion, no nipple discharge, no skin discoloration, no axillary or supraclavicular lymphadenopathy Abdomen: no palpable masses or tenderness, no rebound or guarding Extremities: no edema or skin discoloration or tenderness  Pelvic: Vulva: Normal             Vagina: No gross lesions or discharge  Cervix: No gross lesions or discharge.  [facility] strings visible at [facility].  Pap reflex done.  Uterus  AV, normal size, shape and consistency, non-tender and mobile  Adnexa  Without masses or tenderness  Anus: Normal   Assessment/Plan:  48 y.o. female for annual exam   1. Encounter for routine gynecological examination with Papanicolaou smear of cervix Normal gynecologic exam.  Pap reflex done.  Breast exam normal.  Will schedule screening mammogram at the breast center now.  Health labs with family physician.  2. Postmenopause Early menopause, well on no hormone replacement therapy, except for Mirena IUD.  No postmenopausal bleeding.  Recommend vitamin D supplements, calcium intake of 1200 mg daily and regular weightbearing physical activities.  3. Encounter for routine checking of intrauterine contraceptive device (IUD) Well on Mirena IUD since  2018.  In good position.  4. Overweight (BMI 25.0-29.9) Recommend a lower calorie/carb diet such as [employer].  Intermittent fasting discussed.  Continue with fitness, aerobic activity 5 times a week and weightlifting every 2 days.  Other orders - lisdexamfetamine (VYVANSE) 20 MG capsule; Take 20 mg by mouth daily. - cholecalciferol (VITAMIN D3) 25 MCG (1000 UT) tablet; Take 1,000 Units by mouth daily.  [name] MD, 3:09 PM 09/05/2018

## 2018-09-05 NOTE — Patient Instructions (Signed)
1. Encounter for routine gynecological examination with Papanicolaou smear of cervix Normal gynecologic exam.  Pap reflex done.  Breast exam normal.  Will schedule screening mammogram at the breast center now.  Health labs with family physician.  2. Postmenopause Early menopause, well on no hormone replacement therapy, except for Mirena IUD.  No postmenopausal bleeding.  Recommend vitamin D supplements, calcium intake of 1200 mg daily and regular weightbearing physical activities.  3. Encounter for routine checking of intrauterine contraceptive device (IUD) Well on Mirena IUD since 2018.  In good position.  4. Overweight (BMI 25.0-29.9) Recommend a lower calorie/carb diet such as [employer].  Intermittent fasting discussed.  Continue with fitness, aerobic activity 5 times a week and weightlifting every 2 days.  Other orders - lisdexamfetamine (VYVANSE) 20 MG capsule; Take 20 mg by mouth daily. - cholecalciferol (VITAMIN D3) 25 MCG (1000 UT) tablet; Take 1,000 Units by mouth daily.  Phyllis Rogers, it was a pleasure seeing you today!  I will inform you of your results as soon as they are available.

## 2018-09-06 LAB — PAP IG W/ RFLX HPV ASCU

## 2018-09-08 MED FILL — VYVANSE 20 MG CAPSULE: 20 | 30 days supply | Qty: 30 | Fill #0

## 2018-10-11 DIAGNOSIS — H93299 Other abnormal auditory perceptions, unspecified ear: Secondary | ICD-10-CM | POA: Diagnosis not present

## 2018-10-11 DIAGNOSIS — F419 Anxiety disorder, unspecified: Secondary | ICD-10-CM | POA: Diagnosis not present

## 2018-10-11 DIAGNOSIS — Z79899 Other long term (current) drug therapy: Secondary | ICD-10-CM | POA: Diagnosis not present

## 2018-10-11 DIAGNOSIS — F902 Attention-deficit hyperactivity disorder, combined type: Secondary | ICD-10-CM | POA: Diagnosis not present

## 2018-10-11 MED FILL — VYVANSE 20 MG CAPSULE: 20 | 30 days supply | Qty: 30 | Fill #0

## 2018-11-17 MED FILL — VYVANSE 20 MG CAPSULE: 20 | 30 days supply | Qty: 30 | Fill #0

## 2018-12-05 DIAGNOSIS — L539 Erythematous condition, unspecified: Secondary | ICD-10-CM | POA: Diagnosis not present

## 2018-12-05 DIAGNOSIS — T63484A Toxic effect of venom of other arthropod, undetermined, initial encounter: Secondary | ICD-10-CM | POA: Diagnosis not present

## 2018-12-05 DIAGNOSIS — I10 Essential (primary) hypertension: Secondary | ICD-10-CM | POA: Diagnosis not present

## 2018-12-29 ENCOUNTER — Other Ambulatory Visit: Payer: Self-pay

## 2018-12-29 DIAGNOSIS — Z20822 Contact with and (suspected) exposure to covid-19: Secondary | ICD-10-CM

## 2018-12-30 LAB — NOVEL CORONAVIRUS, NAA: SARS-CoV-2, NAA: NOT DETECTED

## 2019-01-01 MED FILL — VYVANSE 20 MG CAPSULE: 20 | 30 days supply | Qty: 30 | Fill #0

## 2019-01-16 DIAGNOSIS — F902 Attention-deficit hyperactivity disorder, combined type: Secondary | ICD-10-CM | POA: Diagnosis not present

## 2019-01-16 DIAGNOSIS — Z79899 Other long term (current) drug therapy: Secondary | ICD-10-CM | POA: Diagnosis not present

## 2019-02-06 MED FILL — VYVANSE 20 MG CAPSULE: 20 | 30 days supply | Qty: 30 | Fill #0

## 2019-03-12 MED FILL — VYVANSE 20 MG CAPSULE: 20 | 30 days supply | Qty: 30 | Fill #0

## 2019-03-16 DIAGNOSIS — R1030 Lower abdominal pain, unspecified: Secondary | ICD-10-CM | POA: Diagnosis not present

## 2019-03-16 DIAGNOSIS — I10 Essential (primary) hypertension: Secondary | ICD-10-CM | POA: Diagnosis not present

## 2019-03-16 DIAGNOSIS — R300 Dysuria: Secondary | ICD-10-CM | POA: Diagnosis not present

## 2019-03-19 ENCOUNTER — Ambulatory Visit (INDEPENDENT_AMBULATORY_CARE_PROVIDER_SITE_OTHER): Payer: 59 | Admitting: Obstetrics & Gynecology

## 2019-03-19 ENCOUNTER — Telehealth: Payer: Self-pay

## 2019-03-19 ENCOUNTER — Other Ambulatory Visit: Payer: Self-pay

## 2019-03-19 ENCOUNTER — Encounter: Payer: Self-pay | Admitting: Obstetrics & Gynecology

## 2019-03-19 VITALS — BP 146/90

## 2019-03-19 DIAGNOSIS — R300 Dysuria: Secondary | ICD-10-CM | POA: Diagnosis not present

## 2019-03-19 DIAGNOSIS — B9689 Other specified bacterial agents as the cause of diseases classified elsewhere: Secondary | ICD-10-CM

## 2019-03-19 DIAGNOSIS — N760 Acute vaginitis: Secondary | ICD-10-CM

## 2019-03-19 DIAGNOSIS — N898 Other specified noninflammatory disorders of vagina: Secondary | ICD-10-CM | POA: Diagnosis not present

## 2019-03-19 LAB — WET PREP FOR TRICH, YEAST, CLUE

## 2019-03-19 MED ORDER — TINIDAZOLE 500 MG PO TABS: 1.0000 g | ORAL_TABLET | Freq: Two times a day (BID) | ORAL | 0 refills | Status: AC

## 2019-03-19 NOTE — Telephone Encounter (Signed)
Patient called stating she saw PCP because she thought she had UTI but urine was negative. Continues with vaginal irritation, burning with urination and some lower abd tenderness.  I recommended office visit to assess and appt was scheduled for her.

## 2019-03-19 NOTE — Progress Notes (Signed)
    Phyllis Rogers 03/07/1970 BP:[id number]        49 y.o.  [id number] married  RP: Vaginal discharge, burning with urination and frequency of urine worsen x1 week  HPI: Patient has had vaginal and vulvar discomfort for about a month.  Mild increase in vaginal discharge.  Worsening symptoms with burning with urination and urinary frequency for the past week.  No fever.  Patient is menopausal on no hormone replacement therapy.  No postmenopausal bleeding.  Declines STD work-up.   OB History  Gravida Para Term Preterm AB Living  5 3     2 4   SAB TAB Ectopic Multiple Live Births  2            # Outcome Date GA Lbr Len/2nd Weight Sex Delivery Anes PTL Lv  5 SAB           4 SAB           3 Para           2 Para           1 Para             Obstetric Comments  1 adopted boy    Past medical history,surgical history, problem list, medications, allergies, family history and social history were all reviewed and documented in the EPIC chart.   Directed ROS with pertinent positives and negatives documented in the history of present illness/assessment and plan.  Exam:  Vitals:   03/19/19 1433  BP: (!) 146/90   General appearance:  Normal  CVAT:  Negative bilaterally  Abdomen: Normal  Gynecologic exam: Mild d/c at anterior vulva.  Speculum:  Cervix normal.  IUD strings visible at EO.  Increased vaginal d/c.  Wet prep done.  Bimanual exam: Retroverted uterus, normal volume, mobile, nontender, no adnexal mass, nontender bilaterally.  Wet prep:  Clue cells present U/A: Yellow clear, protein negative, nitrites negative, white blood cells 6-10, red blood cells negative, few bacteria.  Urine culture pending.   Assessment/Plan:  49 y.o. [id number]   1. Vaginal discharge Increased vaginal discharge with wet prep confirming bacterial vaginosis.  Results reviewed with patient.  Information given on bacterial vaginosis.  Decision to treat with tinidazole.  Usage reviewed.  Prescription sent to  pharmacy.  Recommend eating more yogurt after treatment in the hope of improving the vaginal flora. - WET PREP FOR TRICH, YEAST, CLUE  2. Bacterial vaginosis As above.  3. Dysuria Urine analysis just mildly perturbed.  Decision to wait on urine culture before deciding if treatment needed.  Recommend drinking plenty of water. - Urinalysis,Complete w/RFL Culture  Other orders - tinidazole (TINDAMAX) 500 MG tablet; Take 2 tablets (1,000 mg total) by mouth 2 (two) times daily for 2 days.  [name] MD, 2:58 PM 03/19/2019

## 2019-03-19 NOTE — Patient Instructions (Signed)
1. Vaginal discharge Increased vaginal discharge with wet prep confirming bacterial vaginosis.  Results reviewed with patient.  Information given on bacterial vaginosis.  Decision to treat with tinidazole.  Usage reviewed.  Prescription sent to pharmacy.  Recommend eating more yogurt after treatment in the hope of improving the vaginal flora. - WET PREP FOR TRICH, YEAST, CLUE  2. Bacterial vaginosis As above.  3. Dysuria Urine analysis just mildly perturbed.  Decision to wait on urine culture before deciding if treatment needed.  Recommend drinking plenty of water. - Urinalysis,Complete w/RFL Culture  Other orders - tinidazole (TINDAMAX) 500 MG tablet; Take 2 tablets (1,000 mg total) by mouth 2 (two) times daily for 2 days.  Phyllis Rogers, it was a pleasure seeing you today!  I will inform you of your urine culture results as soon as it is available.

## 2019-03-21 LAB — URINALYSIS, COMPLETE W/RFL CULTURE
Bilirubin Urine: NEGATIVE
Glucose, UA: NEGATIVE
Hgb urine dipstick: NEGATIVE
Hyaline Cast: NONE SEEN /LPF
Ketones, ur: NEGATIVE
Nitrites, Initial: NEGATIVE
Protein, ur: NEGATIVE
RBC / HPF: NONE SEEN /HPF (ref 0–2)
Specific Gravity, Urine: 1.015 (ref 1.001–1.03)
pH: 7 (ref 5.0–8.0)

## 2019-03-21 LAB — URINE CULTURE
MICRO NUMBER:: 10076404
Result:: NO GROWTH
SPECIMEN QUALITY:: ADEQUATE

## 2019-03-21 LAB — CULTURE INDICATED

## 2019-04-11 DIAGNOSIS — F902 Attention-deficit hyperactivity disorder, combined type: Secondary | ICD-10-CM | POA: Diagnosis not present

## 2019-04-11 DIAGNOSIS — H93299 Other abnormal auditory perceptions, unspecified ear: Secondary | ICD-10-CM | POA: Diagnosis not present

## 2019-04-11 DIAGNOSIS — Z79899 Other long term (current) drug therapy: Secondary | ICD-10-CM | POA: Diagnosis not present

## 2019-04-11 MED FILL — VYVANSE 20 MG CAPSULE: 20 | 30 days supply | Qty: 30 | Fill #0

## 2019-04-14 DIAGNOSIS — Z20828 Contact with and (suspected) exposure to other viral communicable diseases: Secondary | ICD-10-CM | POA: Diagnosis not present

## 2019-04-15 DIAGNOSIS — U071 COVID-19: Secondary | ICD-10-CM | POA: Diagnosis not present

## 2019-04-15 DIAGNOSIS — Z20828 Contact with and (suspected) exposure to other viral communicable diseases: Secondary | ICD-10-CM | POA: Diagnosis not present

## 2019-04-16 ENCOUNTER — Ambulatory Visit: Payer: 59

## 2019-04-16 ENCOUNTER — Telehealth: Payer: Self-pay | Admitting: *Deleted

## 2019-04-16 NOTE — Telephone Encounter (Signed)
Patient was seen and treated for BV infection on OV 03/19/19 with tinidazole 500 MG tablet, reports 2 days ago same symptoms as noted in office visit returned Vaginal discharge, burning. Patient asked if refill can be sent or another medication can be prescribed. Patient is not able to come into office due to positive covid test.

## 2019-04-17 NOTE — Telephone Encounter (Signed)
Please send Metrogel, intravaginally x 5 nights.

## 2019-04-18 MED ORDER — METRONIDAZOLE 0.75 % VA GEL: 1.0000 | Freq: Every day | VAGINAL | 0 refills | Status: DC

## 2019-04-18 NOTE — Telephone Encounter (Signed)
Patient informed, Rx sent.  

## 2019-04-27 DIAGNOSIS — Z0000 Encounter for general adult medical examination without abnormal findings: Secondary | ICD-10-CM | POA: Diagnosis not present

## 2019-04-27 DIAGNOSIS — E7849 Other hyperlipidemia: Secondary | ICD-10-CM | POA: Diagnosis not present

## 2019-05-04 DIAGNOSIS — Z8616 Personal history of COVID-19: Secondary | ICD-10-CM | POA: Diagnosis not present

## 2019-05-04 DIAGNOSIS — Z1331 Encounter for screening for depression: Secondary | ICD-10-CM | POA: Diagnosis not present

## 2019-05-04 DIAGNOSIS — F900 Attention-deficit hyperactivity disorder, predominantly inattentive type: Secondary | ICD-10-CM | POA: Diagnosis not present

## 2019-05-04 DIAGNOSIS — K219 Gastro-esophageal reflux disease without esophagitis: Secondary | ICD-10-CM | POA: Diagnosis not present

## 2019-05-04 DIAGNOSIS — Z8719 Personal history of other diseases of the digestive system: Secondary | ICD-10-CM | POA: Diagnosis not present

## 2019-05-04 DIAGNOSIS — E785 Hyperlipidemia, unspecified: Secondary | ICD-10-CM | POA: Diagnosis not present

## 2019-05-04 DIAGNOSIS — G43909 Migraine, unspecified, not intractable, without status migrainosus: Secondary | ICD-10-CM | POA: Diagnosis not present

## 2019-05-04 DIAGNOSIS — Z0000 Encounter for general adult medical examination without abnormal findings: Secondary | ICD-10-CM | POA: Diagnosis not present

## 2019-05-04 DIAGNOSIS — I10 Essential (primary) hypertension: Secondary | ICD-10-CM | POA: Diagnosis not present

## 2019-05-04 DIAGNOSIS — R82998 Other abnormal findings in urine: Secondary | ICD-10-CM | POA: Diagnosis not present

## 2019-06-01 MED FILL — VYVANSE 20 MG CAPSULE: 20 | 30 days supply | Qty: 30 | Fill #0

## 2019-07-11 MED FILL — VYVANSE 20 MG CAPSULE: 20 | 30 days supply | Qty: 30 | Fill #0

## 2019-07-17 DIAGNOSIS — Z03818 Encounter for observation for suspected exposure to other biological agents ruled out: Secondary | ICD-10-CM | POA: Diagnosis not present

## 2019-07-17 DIAGNOSIS — Z20822 Contact with and (suspected) exposure to covid-19: Secondary | ICD-10-CM | POA: Diagnosis not present

## 2019-08-06 DIAGNOSIS — F902 Attention-deficit hyperactivity disorder, combined type: Secondary | ICD-10-CM | POA: Diagnosis not present

## 2019-08-06 DIAGNOSIS — Z79899 Other long term (current) drug therapy: Secondary | ICD-10-CM | POA: Diagnosis not present

## 2019-08-17 MED FILL — VYVANSE 20 MG CAPSULE: 20 | 30 days supply | Qty: 30 | Fill #0

## 2019-09-06 ENCOUNTER — Encounter: Payer: 59 | Admitting: Obstetrics & Gynecology

## 2019-09-06 DIAGNOSIS — Z0289 Encounter for other administrative examinations: Secondary | ICD-10-CM

## 2019-10-02 MED FILL — VYVANSE 20 MG CAPSULE: 20 | 30 days supply | Qty: 30 | Fill #0

## 2019-11-07 MED FILL — VYVANSE 20 MG CAPSULE: 20 | 30 days supply | Qty: 30 | Fill #0

## 2019-11-14 DIAGNOSIS — D2261 Melanocytic nevi of right upper limb, including shoulder: Secondary | ICD-10-CM | POA: Diagnosis not present

## 2019-11-14 DIAGNOSIS — D225 Melanocytic nevi of trunk: Secondary | ICD-10-CM | POA: Diagnosis not present

## 2019-11-14 DIAGNOSIS — L814 Other melanin hyperpigmentation: Secondary | ICD-10-CM | POA: Diagnosis not present

## 2019-11-14 DIAGNOSIS — D2271 Melanocytic nevi of right lower limb, including hip: Secondary | ICD-10-CM | POA: Diagnosis not present

## 2019-11-14 DIAGNOSIS — D2362 Other benign neoplasm of skin of left upper limb, including shoulder: Secondary | ICD-10-CM | POA: Diagnosis not present

## 2019-11-14 DIAGNOSIS — L821 Other seborrheic keratosis: Secondary | ICD-10-CM | POA: Diagnosis not present

## 2019-11-14 DIAGNOSIS — D2272 Melanocytic nevi of left lower limb, including hip: Secondary | ICD-10-CM | POA: Diagnosis not present

## 2019-11-14 DIAGNOSIS — D2239 Melanocytic nevi of other parts of face: Secondary | ICD-10-CM | POA: Diagnosis not present

## 2019-11-14 DIAGNOSIS — D2262 Melanocytic nevi of left upper limb, including shoulder: Secondary | ICD-10-CM | POA: Diagnosis not present

## 2019-12-12 ENCOUNTER — Other Ambulatory Visit (HOSPITAL_COMMUNITY): Payer: Self-pay | Admitting: Internal Medicine

## 2019-12-12 DIAGNOSIS — F902 Attention-deficit hyperactivity disorder, combined type: Secondary | ICD-10-CM | POA: Diagnosis not present

## 2019-12-12 DIAGNOSIS — Z79899 Other long term (current) drug therapy: Secondary | ICD-10-CM | POA: Diagnosis not present

## 2019-12-12 MED FILL — VYVANSE 20 MG CAPSULE: 20 | 30 days supply | Qty: 30 | Fill #0

## 2020-01-28 MED FILL — VYVANSE 20 MG CAPSULE: 20 | 30 days supply | Qty: 30 | Fill #0

## 2020-02-29 DIAGNOSIS — H524 Presbyopia: Secondary | ICD-10-CM | POA: Diagnosis not present

## 2020-03-06 ENCOUNTER — Other Ambulatory Visit (HOSPITAL_COMMUNITY): Payer: Self-pay | Admitting: Physician Assistant

## 2020-03-06 DIAGNOSIS — Z20822 Contact with and (suspected) exposure to covid-19: Secondary | ICD-10-CM | POA: Diagnosis not present

## 2020-03-06 DIAGNOSIS — Z03818 Encounter for observation for suspected exposure to other biological agents ruled out: Secondary | ICD-10-CM | POA: Diagnosis not present

## 2020-03-06 MED FILL — VYVANSE 20 MG CAPSULE: 20 | 30 days supply | Qty: 30 | Fill #0

## 2020-04-04 ENCOUNTER — Other Ambulatory Visit (HOSPITAL_COMMUNITY): Payer: Self-pay | Admitting: Physician Assistant

## 2020-04-04 MED FILL — VYVANSE 20 MG CAPSULE: 20 | 30 days supply | Qty: 30 | Fill #0

## 2020-04-10 ENCOUNTER — Other Ambulatory Visit (HOSPITAL_COMMUNITY): Payer: Self-pay | Admitting: Internal Medicine

## 2020-04-10 DIAGNOSIS — F902 Attention-deficit hyperactivity disorder, combined type: Secondary | ICD-10-CM | POA: Diagnosis not present

## 2020-04-10 DIAGNOSIS — F419 Anxiety disorder, unspecified: Secondary | ICD-10-CM | POA: Diagnosis not present

## 2020-04-10 DIAGNOSIS — Z79899 Other long term (current) drug therapy: Secondary | ICD-10-CM | POA: Diagnosis not present

## 2020-04-10 DIAGNOSIS — H93299 Other abnormal auditory perceptions, unspecified ear: Secondary | ICD-10-CM | POA: Diagnosis not present

## 2020-05-02 DIAGNOSIS — E785 Hyperlipidemia, unspecified: Secondary | ICD-10-CM | POA: Diagnosis not present

## 2020-05-02 DIAGNOSIS — Z0000 Encounter for general adult medical examination without abnormal findings: Secondary | ICD-10-CM | POA: Diagnosis not present

## 2020-05-09 ENCOUNTER — Other Ambulatory Visit: Payer: Self-pay | Admitting: Internal Medicine

## 2020-05-09 DIAGNOSIS — F900 Attention-deficit hyperactivity disorder, predominantly inattentive type: Secondary | ICD-10-CM | POA: Diagnosis not present

## 2020-05-09 DIAGNOSIS — Z1331 Encounter for screening for depression: Secondary | ICD-10-CM | POA: Diagnosis not present

## 2020-05-09 DIAGNOSIS — Z1339 Encounter for screening examination for other mental health and behavioral disorders: Secondary | ICD-10-CM | POA: Diagnosis not present

## 2020-05-09 DIAGNOSIS — I10 Essential (primary) hypertension: Secondary | ICD-10-CM | POA: Diagnosis not present

## 2020-05-09 DIAGNOSIS — Z1231 Encounter for screening mammogram for malignant neoplasm of breast: Secondary | ICD-10-CM

## 2020-05-09 DIAGNOSIS — E785 Hyperlipidemia, unspecified: Secondary | ICD-10-CM | POA: Diagnosis not present

## 2020-05-09 DIAGNOSIS — Z0000 Encounter for general adult medical examination without abnormal findings: Secondary | ICD-10-CM | POA: Diagnosis not present

## 2020-05-12 MED FILL — VYVANSE 20 MG CAPSULE: 20 | 30 days supply | Qty: 30 | Fill #0

## 2020-06-23 ENCOUNTER — Other Ambulatory Visit (HOSPITAL_COMMUNITY): Payer: Self-pay

## 2020-06-23 MED FILL — Lisdexamfetamine Dimesylate Cap 20 MG: ORAL | 30 days supply | Qty: 30 | Fill #0 | Status: AC

## 2020-07-02 ENCOUNTER — Ambulatory Visit
Admission: RE | Admit: 2020-07-02 | Discharge: 2020-07-02 | Disposition: A | Discharge status: Home / Self Care | Payer: 59 | Source: Ambulatory Visit | Attending: Internal Medicine | Admitting: Internal Medicine

## 2020-07-02 ENCOUNTER — Other Ambulatory Visit: Payer: Self-pay

## 2020-07-02 DIAGNOSIS — Z1231 Encounter for screening mammogram for malignant neoplasm of breast: Secondary | ICD-10-CM

## 2020-08-05 ENCOUNTER — Other Ambulatory Visit (HOSPITAL_COMMUNITY): Payer: Self-pay | Admitting: Internal Medicine

## 2020-08-05 ENCOUNTER — Other Ambulatory Visit (HOSPITAL_COMMUNITY): Payer: Self-pay

## 2020-08-07 ENCOUNTER — Other Ambulatory Visit (HOSPITAL_COMMUNITY): Payer: Self-pay

## 2020-08-07 MED ORDER — VYVANSE 20 MG PO CAPS
20.0000 mg | ORAL_CAPSULE | Freq: Every day | ORAL | 0 refills | Status: DC
  Filled 2020-08-07: qty 30, 30d supply, fill #0

## 2020-08-29 ENCOUNTER — Other Ambulatory Visit (HOSPITAL_BASED_OUTPATIENT_CLINIC_OR_DEPARTMENT_OTHER): Payer: Self-pay

## 2020-09-24 ENCOUNTER — Other Ambulatory Visit (HOSPITAL_BASED_OUTPATIENT_CLINIC_OR_DEPARTMENT_OTHER): Payer: Self-pay

## 2020-09-26 ENCOUNTER — Other Ambulatory Visit (HOSPITAL_BASED_OUTPATIENT_CLINIC_OR_DEPARTMENT_OTHER): Payer: Self-pay

## 2020-09-26 DIAGNOSIS — Z79899 Other long term (current) drug therapy: Secondary | ICD-10-CM | POA: Diagnosis not present

## 2020-09-26 DIAGNOSIS — F902 Attention-deficit hyperactivity disorder, combined type: Secondary | ICD-10-CM | POA: Diagnosis not present

## 2020-09-26 MED ORDER — VYVANSE 20 MG PO CAPS
ORAL_CAPSULE | ORAL | 0 refills | Status: DC
Start: 2020-09-26 — End: 2020-12-26
  Filled 2020-09-26 – 2020-12-11 (×2): qty 30, 30d supply, fill #0

## 2020-09-26 MED ORDER — VYVANSE 20 MG PO CAPS
ORAL_CAPSULE | ORAL | 0 refills | Status: DC
  Filled 2020-11-03: qty 30, 30d supply, fill #0

## 2020-09-26 MED ORDER — VYVANSE 20 MG PO CAPS
ORAL_CAPSULE | ORAL | 0 refills | Status: DC
  Filled 2020-09-26: qty 30, 30d supply, fill #0

## 2020-09-29 DIAGNOSIS — Z79899 Other long term (current) drug therapy: Secondary | ICD-10-CM | POA: Diagnosis not present

## 2020-09-29 DIAGNOSIS — F902 Attention-deficit hyperactivity disorder, combined type: Secondary | ICD-10-CM | POA: Diagnosis not present

## 2020-10-24 ENCOUNTER — Other Ambulatory Visit (HOSPITAL_COMMUNITY): Payer: Self-pay

## 2020-11-03 ENCOUNTER — Other Ambulatory Visit (HOSPITAL_BASED_OUTPATIENT_CLINIC_OR_DEPARTMENT_OTHER): Payer: Self-pay

## 2020-12-11 ENCOUNTER — Other Ambulatory Visit (HOSPITAL_BASED_OUTPATIENT_CLINIC_OR_DEPARTMENT_OTHER): Payer: Self-pay

## 2020-12-26 ENCOUNTER — Other Ambulatory Visit (HOSPITAL_BASED_OUTPATIENT_CLINIC_OR_DEPARTMENT_OTHER): Payer: Self-pay

## 2020-12-26 DIAGNOSIS — Z79899 Other long term (current) drug therapy: Secondary | ICD-10-CM | POA: Diagnosis not present

## 2020-12-26 DIAGNOSIS — F902 Attention-deficit hyperactivity disorder, combined type: Secondary | ICD-10-CM | POA: Diagnosis not present

## 2020-12-26 MED ORDER — VYVANSE 20 MG PO CAPS
ORAL_CAPSULE | ORAL | 0 refills | Status: DC
  Filled 2020-12-26 – 2021-01-13 (×2): qty 30, 30d supply, fill #0

## 2020-12-26 MED ORDER — VYVANSE 20 MG PO CAPS
ORAL_CAPSULE | ORAL | 0 refills | Status: DC
  Filled 2021-02-27: qty 30, 30d supply, fill #0

## 2020-12-26 MED ORDER — VYVANSE 20 MG PO CAPS: ORAL_CAPSULE | ORAL | 0 refills | Status: DC

## 2021-01-13 ENCOUNTER — Other Ambulatory Visit (HOSPITAL_BASED_OUTPATIENT_CLINIC_OR_DEPARTMENT_OTHER): Payer: Self-pay

## 2021-01-24 ENCOUNTER — Telehealth: Payer: 59 | Admitting: Nurse Practitioner

## 2021-01-24 DIAGNOSIS — B085 Enteroviral vesicular pharyngitis: Secondary | ICD-10-CM | POA: Diagnosis not present

## 2021-01-24 MED ORDER — AMOXICILLIN-POT CLAVULANATE 875-125 MG PO TABS: 1.0000 | ORAL_TABLET | Freq: Two times a day (BID) | ORAL | 0 refills | Status: DC

## 2021-01-24 NOTE — Progress Notes (Signed)
Virtual Visit Consent   Phyllis Rogers, you are scheduled for a virtual visit with Mary-Margaret Hassell Done, FNP, a [facility] provider, today.     Just as with appointments in the office, your consent must be obtained to participate.  Your consent will be active for this visit and any virtual visit you may have with one of our providers in the next 365 days.     If you have a MyChart account, a copy of this consent can be sent to you electronically.  All virtual visits are billed to your insurance company just like a traditional visit in the office.    As this is a virtual visit, video technology does not allow for your provider to perform a traditional examination.  This may limit your provider's ability to fully assess your condition.  If your provider identifies any concerns that need to be evaluated in person or the need to arrange testing (such as labs, EKG, etc.), we will make arrangements to do so.     Although advances in technology are sophisticated, we cannot ensure that it will always work on either your end or our end.  If the connection with a video visit is poor, the visit may have to be switched to a telephone visit.  With either a video or telephone visit, we are not always able to ensure that we have a secure connection.     I need to obtain your verbal consent now.   Are you willing to proceed with your visit today? YES   Denesha Brouse has provided verbal consent on 01/24/2021 for a virtual visit (video or telephone).   Mary-Margaret Hassell Done, FNP   Date: 01/24/2021 9:06 AM   Virtual Visit via Video Note   I, Mary-Margaret Hassell Done, connected with Phyllis Rogers ([id number], 03-15-1970) on 01/24/21 at  9:15 AM EST by a video-enabled telemedicine application and verified that I am speaking with the correct person using two identifiers.  Location: Patient: Virtual Visit Location Patient: Home Provider: Virtual Visit Location Provider: Mobile   I discussed the limitations of  evaluation and management by telemedicine and the availability of in person appointments. The patient expressed understanding and agreed to proceed.    History of Present Illness: Phyllis Rogers is a 50 y.o. who identifies as a female who was assigned female at birth, and is being seen today for sore throat.  HPI: Patient states they have had a cold virus going around her house for a week. The last 24 hours her sore thorat and congestion has gotten worse. Her husband is a physician and he says her throat is red a blister and swollen..   Review of Systems  Constitutional:  Positive for malaise/fatigue. Negative for chills and fever (doe snot usually get fevers).  HENT:  Positive for congestion and sore throat.   Respiratory:  Negative for cough.   Musculoskeletal:  Negative for myalgias.  Neurological:  Negative for headaches.   Problems: There are no problems to display for this patient.   Allergies: No Known Allergies Medications:  Current Outpatient Medications:    cholecalciferol (VITAMIN D3) 25 MCG (1000 UT) tablet, Take 1,000 Units by mouth daily., Disp: , Rfl:    FIBER ADULT GUMMIES PO, Take 2 tablets by mouth daily., Disp: , Rfl:    lisdexamfetamine (VYVANSE) 20 MG capsule, Take 20 mg by mouth daily., Disp: , Rfl:    lisdexamfetamine (VYVANSE) 20 MG capsule, TAKE 1 CAPSULE BY MOUTH ONCE DAILY., Disp: 30 capsule, Rfl: 0  lisdexamfetamine (VYVANSE) 20 MG capsule, 1 capsule by mouth daily; May be filled 30 days after date prescribed, Disp: 30 capsule, Rfl: 0   lisdexamfetamine (VYVANSE) 20 MG capsule, Take 1 capsule by mouth daily, Disp: 30 capsule, Rfl: 0   lisdexamfetamine (VYVANSE) 20 MG capsule, Take 1 capsule by mouth daily; May be filled 60 days after date prescribed, Disp: 30 capsule, Rfl: 0   lisdexamfetamine (VYVANSE) 20 MG capsule, Take 1 capsule by mouth daily; May be filled 30 days after date prescribed, Disp: 30 capsule, Rfl: 0   lisdexamfetamine (VYVANSE) 20 MG capsule,  Take 1 capsule by mouth daily, Disp: 30 capsule, Rfl: 0   metroNIDAZOLE (METROGEL) 0.75 % vaginal gel, Place 1 Applicatorful vaginally at bedtime. For 5 nights, Disp: 70 g, Rfl: 0   Multiple Vitamins-Minerals (MULTIVITAMIN GUMMIES WOMENS) CHEW, Chew by mouth., Disp: , Rfl:    omeprazole (PRILOSEC) 20 MG capsule, Take 1 capsule (20 mg total) by mouth daily., Disp: 30 capsule, Rfl: 11   VALSARTAN PO, Take 1 tablet by mouth daily., Disp: , Rfl:   Current Facility-Administered Medications:    0.9 %  sodium chloride infusion, 500 mL, Intravenous, Continuous, Lucio Edward T, MD   0.9 %  sodium chloride infusion, 500 mL, Intravenous, Continuous, Lucio Edward T, MD   0.9 %  sodium chloride infusion, 500 mL, Intravenous, Once, Ladene Artist, MD  Observations/Objective: Patient is well-developed, well-nourished in no acute distress.  Resting comfortably  at home.  Head is normocephalic, atraumatic.  No labored breathing.  Speech is clear and coherent with logical content.  Patient is alert and oriented at baseline.  Post oral pharygitis  Assessment and Plan:  Phyllis Rogers in today with chief complaint of No chief complaint on file.   1. Pharyngitis due to Coxsackie virus Force fluids Motrin or tylenol OTC OTC decongestant Throat lozenges if help New toothbrush in 3 days Meds ordered this encounter  Medications   amoxicillin-clavulanate (AUGMENTIN) 875-125 MG tablet    Sig: Take 1 tablet by mouth 2 (two) times daily.    Dispense:  14 tablet    Refill:  0    Order Specific Question:   Supervising Provider    Answer:   Noemi Chapel [[id number]]       Follow Up Instructions: I discussed the assessment and treatment plan with the patient. The patient was provided an opportunity to ask questions and all were answered. The patient agreed with the plan and demonstrated an understanding of the instructions.  A copy of instructions were sent to the patient via MyChart.  The patient was  advised to call back or seek an in-person evaluation if the symptoms worsen or if the condition fails to improve as anticipated.  Time:  I spent 11 minutes with the patient via telehealth technology discussing the above problems/concerns.    Mary-Margaret Hassell Done, FNP

## 2021-01-24 NOTE — Patient Instructions (Signed)
Pharyngitis ?Pharyngitis is a sore throat (pharynx). This is when there is redness, pain, and swelling in your throat. Most of the time, this condition gets better on its own. In some cases, you may need medicine. ?What are the causes? ?An infection from a virus. ?An infection from bacteria. ?Allergies. ?What increases the risk? ?Being 5-48 years old. ?Being in crowded environments. These include: ?Daycares. ?Schools. ?Dormitories. ?Living in a place with cold temperatures outside. ?Having a weakened disease-fighting (immune) system. ?What are the signs or symptoms? ?Symptoms may vary depending on the cause. Common symptoms include: ?Sore throat. ?Tiredness (fatigue). ?Low-grade fever. ?Stuffy nose. ?Cough. ?Headache. ?Other symptoms may include: ?Glands in the neck (lymph nodes) that are swollen. ?Skin rashes. ?Film on the throat or tonsils. This can be caused by an infection from bacteria. ?Vomiting. ?Red, itchy eyes. ?Loss of appetite. ?Joint pain and muscle aches. ?Tonsils that are temporarily bigger than usual (enlarged). ?How is this treated? ?Many times, treatment is not needed. This condition usually gets better in 3-4 days without treatment. ?If the infection is caused by a bacteria, you may be need to take antibiotics. ?Follow these instructions at home: ?Medicines ?Take over-the-counter and prescription medicines only as told by your doctor. ?If you were prescribed an antibiotic medicine, take it as told by your doctor. Do not stop taking the antibiotic even if you start to feel better. ?Use throat lozenges or sprays to soothe your throat as told by your doctor. ?Children can get pharyngitis. Do not give your child aspirin. ?Managing pain ?To help with pain, try: ?Sipping warm liquids, such as: ?Broth. ?Herbal tea. ?Warm water. ?Eating or drinking cold or frozen liquids, such as frozen ice pops. ?Rinsing your mouth (gargle) with a salt water mixture 3-4 times a day or as needed. ?To make salt water,  dissolve ?-1 tsp (3-6 g) of salt in 1 cup (237 mL) of warm water. ?Do not swallow this mixture. ?Sucking on hard candy or throat lozenges. ?Putting a cool-mist humidifier in your bedroom at night to moisten the air. ?Sitting in the bathroom with the door closed for 5-10 minutes while you run hot water in the shower. ? ?General instructions ? ?Do not smoke or use any products that contain nicotine or tobacco. If you need help quitting, ask your doctor. ?Rest as told by your doctor. ?Drink enough fluid to keep your pee (urine) pale yellow. ?How is this prevented? ?Wash your hands often for at least 20 seconds with soap and water. If soap and water are not available, use hand sanitizer. ?Do not touch your eyes, nose, or mouth with unwashed hands. Wash hands after touching these areas. ?Do not share cups or eating utensils. ?Avoid close contact with people who are sick. ?Contact a doctor if: ?You have large, tender lumps in your neck. ?You have a rash. ?You cough up green, yellow-brown, or bloody spit. ?Get help right away if: ?You have a stiff neck. ?You drool or cannot swallow liquids. ?You cannot drink or take medicines without vomiting. ?You have very bad pain that does not go away with medicine. ?You have problems breathing, and it is not from a stuffy nose. ?You have new pain and swelling in your knees, ankles, wrists, or elbows. ?These symptoms may be an emergency. Get help right away. Call your local emergency services (911 in the [place] ?Do not wait to see if the symptoms will go away. ?Do not drive yourself to the hospital. ?Summary ?Pharyngitis is a sore throat (pharynx). This is   when there is redness, pain, and swelling in your throat. ?Most of the time, pharyngitis gets better on its own. Sometimes, you may need medicine. ?If you were prescribed an antibiotic medicine, take it as told by your doctor. Do not stop taking the antibiotic even if you start to feel better. ?This information is not intended to  replace advice given to you by your health care provider. Make sure you discuss any questions you have with your health care provider. ?Document Revised: 05/07/2020 Document Reviewed: 05/07/2020 ?Elsevier Patient Education ? 2022 [employer] ? ?

## 2021-02-25 ENCOUNTER — Other Ambulatory Visit (HOSPITAL_BASED_OUTPATIENT_CLINIC_OR_DEPARTMENT_OTHER): Payer: Self-pay

## 2021-02-27 ENCOUNTER — Other Ambulatory Visit (HOSPITAL_BASED_OUTPATIENT_CLINIC_OR_DEPARTMENT_OTHER): Payer: Self-pay

## 2021-03-27 ENCOUNTER — Other Ambulatory Visit (HOSPITAL_BASED_OUTPATIENT_CLINIC_OR_DEPARTMENT_OTHER): Payer: Self-pay

## 2021-03-27 DIAGNOSIS — F902 Attention-deficit hyperactivity disorder, combined type: Secondary | ICD-10-CM | POA: Diagnosis not present

## 2021-03-27 DIAGNOSIS — Z79899 Other long term (current) drug therapy: Secondary | ICD-10-CM | POA: Diagnosis not present

## 2021-03-27 MED ORDER — VYVANSE 20 MG PO CAPS
ORAL_CAPSULE | ORAL | 0 refills | Status: DC
  Filled 2021-03-27 (×2): qty 30, 30d supply, fill #0

## 2021-03-27 MED ORDER — AMPHETAMINE-DEXTROAMPHET ER 10 MG PO CP24
ORAL_CAPSULE | ORAL | 0 refills | Status: DC
  Filled 2021-03-27 – 2021-05-19 (×2): qty 30, 30d supply, fill #0

## 2021-05-19 ENCOUNTER — Other Ambulatory Visit (HOSPITAL_BASED_OUTPATIENT_CLINIC_OR_DEPARTMENT_OTHER): Payer: Self-pay

## 2021-05-19 MED ORDER — VYVANSE 20 MG PO CAPS
ORAL_CAPSULE | ORAL | 0 refills | Status: DC
  Filled 2021-05-19: qty 30, 30d supply, fill #0

## 2021-06-23 ENCOUNTER — Other Ambulatory Visit (HOSPITAL_BASED_OUTPATIENT_CLINIC_OR_DEPARTMENT_OTHER): Payer: Self-pay

## 2021-06-23 MED ORDER — AMPHETAMINE-DEXTROAMPHET ER 15 MG PO CP24
ORAL_CAPSULE | ORAL | 0 refills | Status: DC
  Filled 2021-06-23: qty 30, 30d supply, fill #0

## 2021-07-29 ENCOUNTER — Other Ambulatory Visit (HOSPITAL_BASED_OUTPATIENT_CLINIC_OR_DEPARTMENT_OTHER): Payer: Self-pay

## 2021-07-29 MED ORDER — AMPHETAMINE-DEXTROAMPHET ER 15 MG PO CP24
ORAL_CAPSULE | ORAL | 0 refills | Status: DC
  Filled 2021-07-29: qty 30, 30d supply, fill #0

## 2021-09-09 ENCOUNTER — Other Ambulatory Visit (HOSPITAL_BASED_OUTPATIENT_CLINIC_OR_DEPARTMENT_OTHER): Payer: Self-pay

## 2021-09-09 MED ORDER — AMPHETAMINE-DEXTROAMPHET ER 15 MG PO CP24
ORAL_CAPSULE | ORAL | 0 refills | Status: DC
  Filled 2021-09-09: qty 30, 30d supply, fill #0

## 2021-10-29 ENCOUNTER — Other Ambulatory Visit (HOSPITAL_BASED_OUTPATIENT_CLINIC_OR_DEPARTMENT_OTHER): Payer: Self-pay

## 2021-10-29 MED ORDER — AMPHETAMINE-DEXTROAMPHET ER 15 MG PO CP24
15.0000 mg | ORAL_CAPSULE | Freq: Every day | ORAL | 0 refills | Status: DC
  Filled 2021-10-29: qty 30, 30d supply, fill #0

## 2021-11-27 ENCOUNTER — Other Ambulatory Visit (HOSPITAL_BASED_OUTPATIENT_CLINIC_OR_DEPARTMENT_OTHER): Payer: Self-pay

## 2021-11-27 MED ORDER — AMPHETAMINE-DEXTROAMPHET ER 15 MG PO CP24
15.0000 mg | ORAL_CAPSULE | Freq: Every day | ORAL | 0 refills | Status: DC
  Filled 2021-11-27: qty 30, 30d supply, fill #0

## 2021-11-30 ENCOUNTER — Other Ambulatory Visit (HOSPITAL_BASED_OUTPATIENT_CLINIC_OR_DEPARTMENT_OTHER): Payer: Self-pay

## 2021-11-30 ENCOUNTER — Other Ambulatory Visit: Payer: Self-pay | Admitting: Internal Medicine

## 2021-11-30 ENCOUNTER — Ambulatory Visit
Admission: RE | Admit: 2021-11-30 | Discharge: 2021-11-30 | Disposition: A | Discharge status: Home / Self Care | Payer: 59 | Source: Ambulatory Visit | Attending: Internal Medicine | Admitting: Internal Medicine

## 2021-11-30 DIAGNOSIS — M79645 Pain in left finger(s): Secondary | ICD-10-CM

## 2021-11-30 DIAGNOSIS — M898X8 Other specified disorders of bone, other site: Secondary | ICD-10-CM

## 2021-11-30 MED ORDER — VALSARTAN-HYDROCHLOROTHIAZIDE 320-12.5 MG PO TABS
1.0000 | ORAL_TABLET | Freq: Every day | ORAL | 4 refills | Status: DC
  Filled 2021-11-30: qty 30, 30d supply, fill #0
  Filled 2022-01-21: qty 30, 30d supply, fill #1
  Filled 2022-03-01: qty 30, 30d supply, fill #2
  Filled 2022-04-19: qty 30, 30d supply, fill #3
  Filled 2022-05-21: qty 30, 30d supply, fill #4
  Filled 2022-06-30: qty 30, 30d supply, fill #5
  Filled 2022-08-12: qty 30, 30d supply, fill #6
  Filled 2022-09-27: qty 30, 30d supply, fill #7
  Filled 2022-11-09: qty 30, 30d supply, fill #8

## 2021-12-22 IMAGING — MG MM DIGITAL SCREENING BILAT W/ TOMO AND CAD
8 series · 9 of 24 positions shown · non-contrast
Comparison: Previous exam(s).

CLINICAL DATA: Screening.

EXAM:
DIGITAL SCREENING BILATERAL MAMMOGRAM WITH TOMOSYNTHESIS AND CAD
TECHNIQUE: Bilateral screening digital craniocaudal and mediolateral oblique
mammograms were obtained. Bilateral screening digital breast
tomosynthesis was performed. The images were evaluated with
computer-aided detection.

[R CC synth-2D]
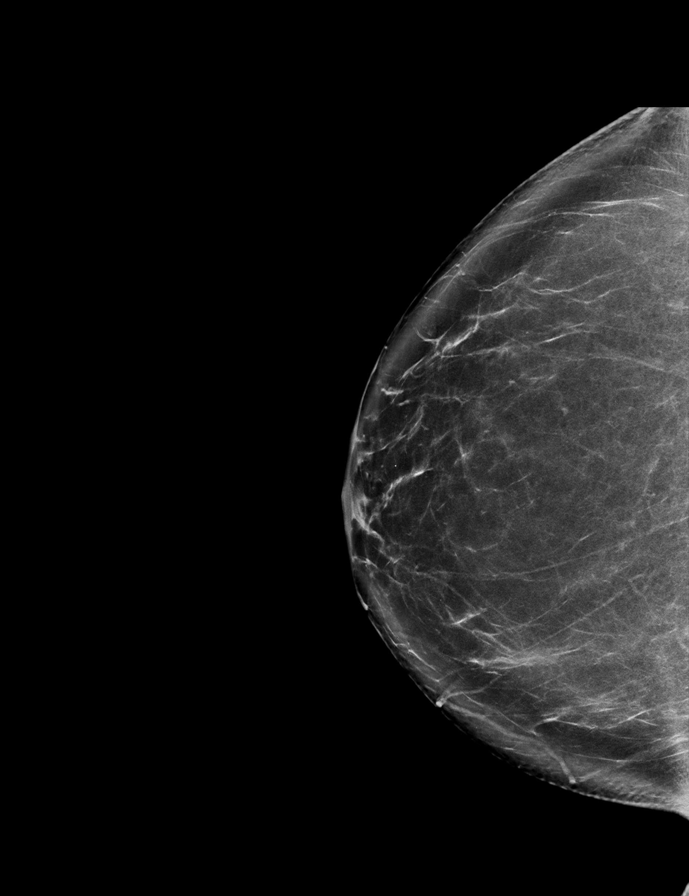

[R MLO synth-2D]
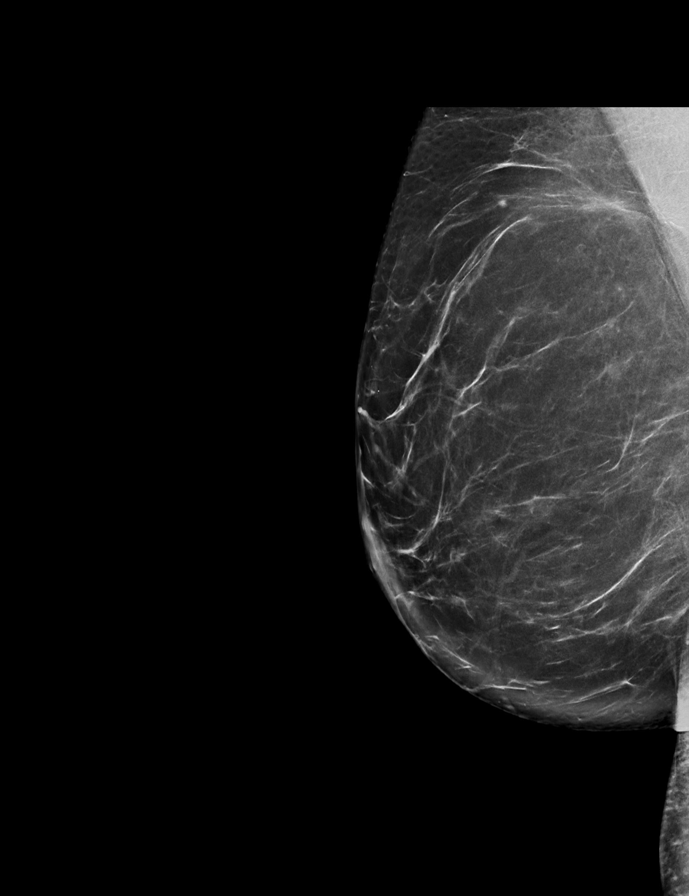

[L MLO synth-2D]
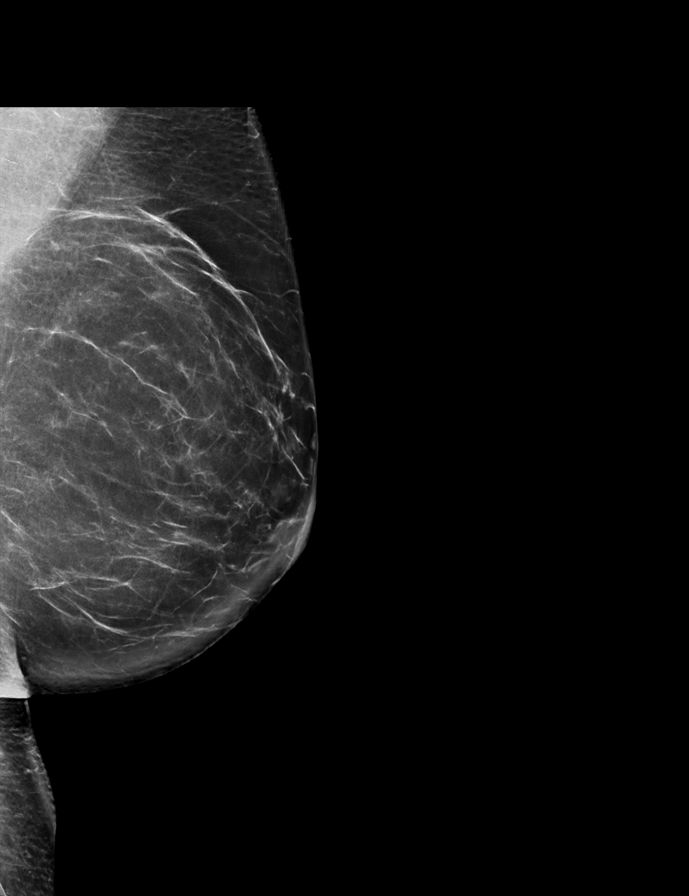

[L CC synth-2D]
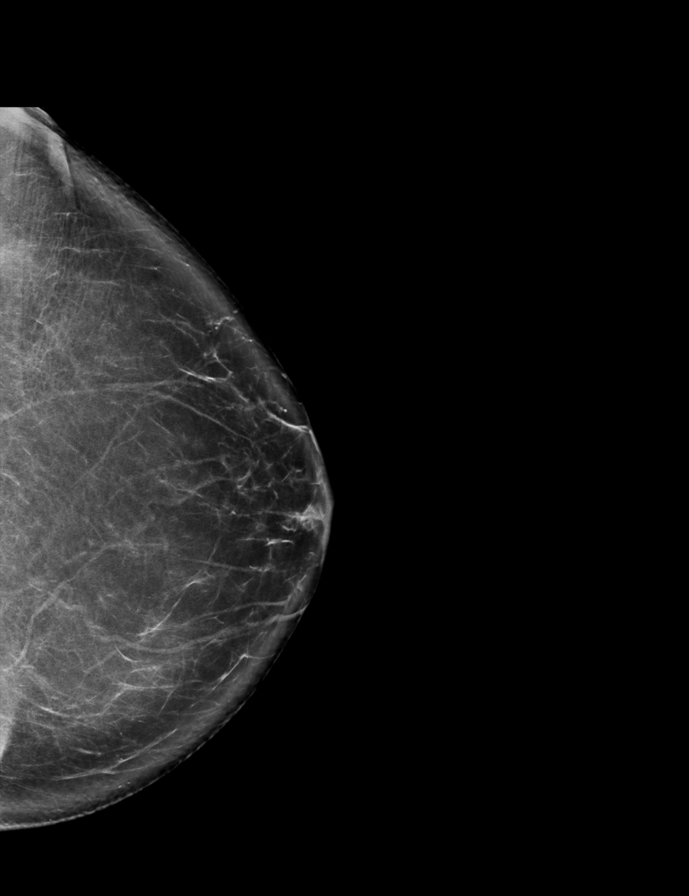

[R MLO tomo · 2 of 77 frames shown]
[frame 25/77]
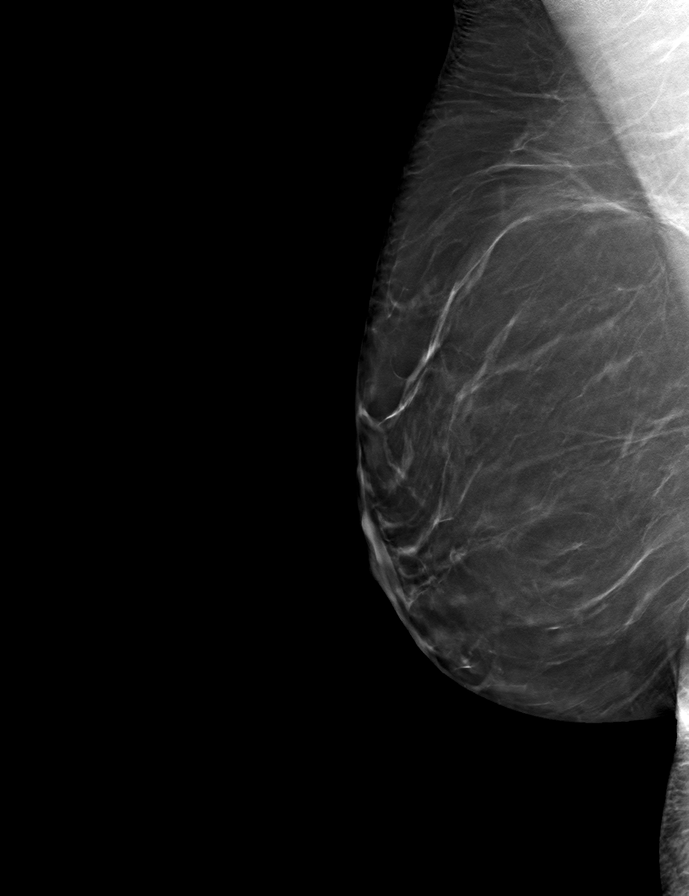
[frame 39/77]
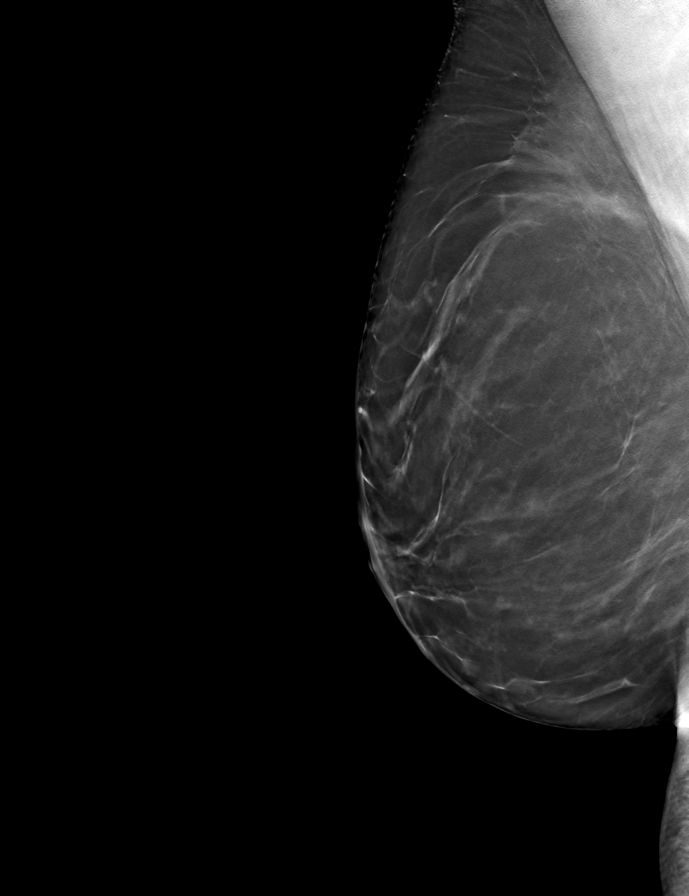

[R CC tomo · tomo slice 41/80.0]
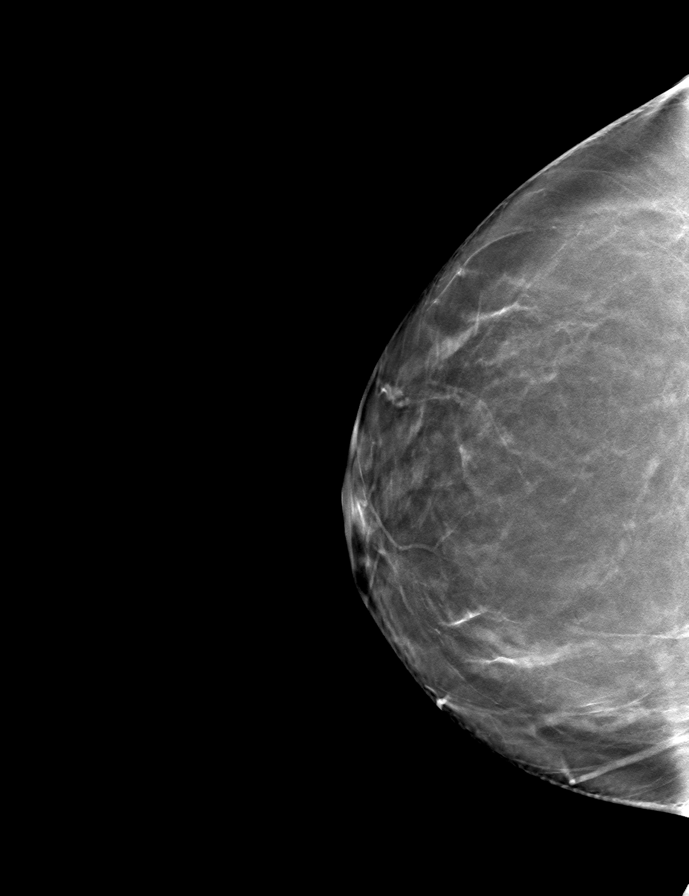

[L CC tomo · tomo slice 39/76.0]
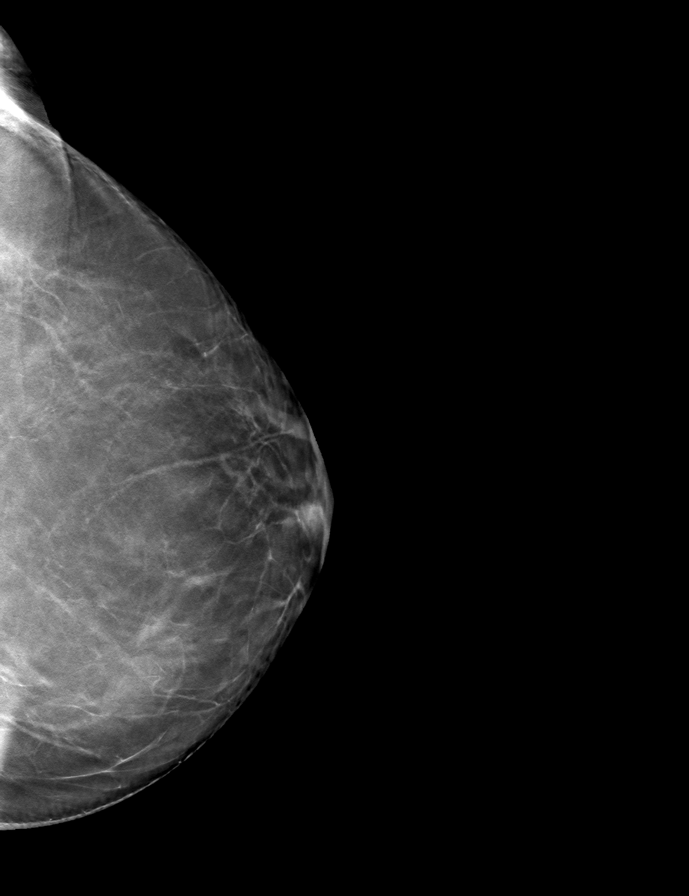

[L MLO tomo · tomo slice 39/76.0]
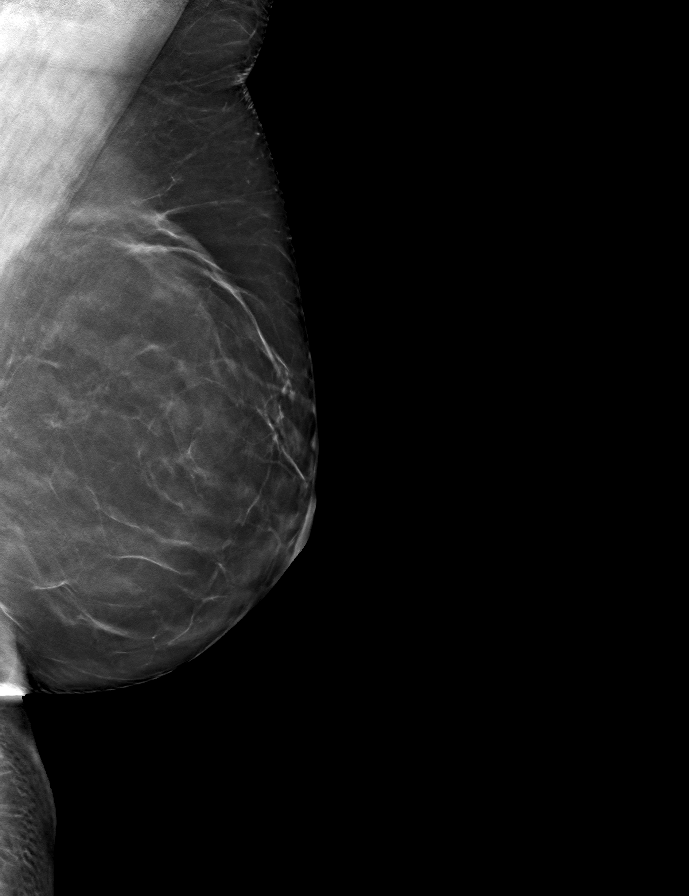

[9 of 24 positions shown; findings below may reference images not displayed]

ACR Breast Density Category b: There are scattered areas of
fibroglandular density.
FINDINGS: There are no findings suspicious for malignancy. The images were
evaluated with computer-aided detection.
IMPRESSION: No mammographic evidence of malignancy. A result letter of this
screening mammogram will be mailed directly to the patient.

RECOMMENDATION:
Screening mammogram in one year. (Code:WJ-[id number])

BI-RADS CATEGORY  1: Negative.

## 2021-12-28 ENCOUNTER — Other Ambulatory Visit (HOSPITAL_BASED_OUTPATIENT_CLINIC_OR_DEPARTMENT_OTHER): Payer: Self-pay

## 2021-12-28 MED ORDER — AMPHETAMINE-DEXTROAMPHET ER 15 MG PO CP24
15.0000 mg | ORAL_CAPSULE | Freq: Every day | ORAL | 0 refills | Status: DC
  Filled 2021-12-28: qty 30, 30d supply, fill #0

## 2021-12-29 ENCOUNTER — Other Ambulatory Visit (HOSPITAL_BASED_OUTPATIENT_CLINIC_OR_DEPARTMENT_OTHER): Payer: Self-pay

## 2022-01-21 ENCOUNTER — Other Ambulatory Visit (HOSPITAL_BASED_OUTPATIENT_CLINIC_OR_DEPARTMENT_OTHER): Payer: Self-pay

## 2022-02-03 ENCOUNTER — Other Ambulatory Visit (HOSPITAL_BASED_OUTPATIENT_CLINIC_OR_DEPARTMENT_OTHER): Payer: Self-pay

## 2022-02-03 MED ORDER — AMPHETAMINE-DEXTROAMPHET ER 15 MG PO CP24
15.0000 mg | ORAL_CAPSULE | Freq: Every day | ORAL | 0 refills | Status: DC
  Filled 2022-02-03: qty 30, 30d supply, fill #0

## 2022-02-12 ENCOUNTER — Other Ambulatory Visit (HOSPITAL_BASED_OUTPATIENT_CLINIC_OR_DEPARTMENT_OTHER): Payer: Self-pay

## 2022-03-22 ENCOUNTER — Other Ambulatory Visit (HOSPITAL_BASED_OUTPATIENT_CLINIC_OR_DEPARTMENT_OTHER): Payer: Self-pay

## 2022-03-22 MED ORDER — AMPHETAMINE-DEXTROAMPHET ER 15 MG PO CP24
15.0000 mg | ORAL_CAPSULE | Freq: Every day | ORAL | 0 refills | Status: DC
  Filled 2022-03-22: qty 30, 30d supply, fill #0

## 2022-04-19 ENCOUNTER — Other Ambulatory Visit (HOSPITAL_BASED_OUTPATIENT_CLINIC_OR_DEPARTMENT_OTHER): Payer: Self-pay

## 2022-04-22 ENCOUNTER — Other Ambulatory Visit: Payer: Self-pay

## 2022-04-26 ENCOUNTER — Other Ambulatory Visit (HOSPITAL_BASED_OUTPATIENT_CLINIC_OR_DEPARTMENT_OTHER): Payer: Self-pay

## 2022-04-26 MED ORDER — AMPHETAMINE-DEXTROAMPHET ER 15 MG PO CP24
15.0000 mg | ORAL_CAPSULE | Freq: Every day | ORAL | 0 refills | Status: DC
  Filled 2022-04-26: qty 30, 30d supply, fill #0

## 2022-04-27 ENCOUNTER — Other Ambulatory Visit (HOSPITAL_BASED_OUTPATIENT_CLINIC_OR_DEPARTMENT_OTHER): Payer: Self-pay

## 2022-05-25 ENCOUNTER — Other Ambulatory Visit (HOSPITAL_BASED_OUTPATIENT_CLINIC_OR_DEPARTMENT_OTHER): Payer: Self-pay

## 2022-05-26 ENCOUNTER — Other Ambulatory Visit (HOSPITAL_BASED_OUTPATIENT_CLINIC_OR_DEPARTMENT_OTHER): Payer: Self-pay

## 2022-05-28 ENCOUNTER — Other Ambulatory Visit: Payer: Self-pay

## 2022-05-28 ENCOUNTER — Other Ambulatory Visit (HOSPITAL_BASED_OUTPATIENT_CLINIC_OR_DEPARTMENT_OTHER): Payer: Self-pay

## 2022-05-28 MED ORDER — AMPHETAMINE-DEXTROAMPHET ER 15 MG PO CP24
15.0000 mg | ORAL_CAPSULE | Freq: Every day | ORAL | 0 refills | Status: DC
  Filled 2022-05-28: qty 30, 30d supply, fill #0

## 2022-06-23 ENCOUNTER — Other Ambulatory Visit (HOSPITAL_BASED_OUTPATIENT_CLINIC_OR_DEPARTMENT_OTHER): Payer: Self-pay

## 2022-06-23 MED ORDER — AMPHETAMINE-DEXTROAMPHET ER 15 MG PO CP24
15.0000 mg | ORAL_CAPSULE | Freq: Every day | ORAL | 0 refills | Status: DC
  Filled 2022-06-30: qty 30, 30d supply, fill #0

## 2022-06-24 ENCOUNTER — Other Ambulatory Visit (HOSPITAL_BASED_OUTPATIENT_CLINIC_OR_DEPARTMENT_OTHER): Payer: Self-pay

## 2022-06-30 ENCOUNTER — Other Ambulatory Visit (HOSPITAL_COMMUNITY): Payer: Self-pay

## 2022-06-30 ENCOUNTER — Other Ambulatory Visit (HOSPITAL_BASED_OUTPATIENT_CLINIC_OR_DEPARTMENT_OTHER): Payer: Self-pay

## 2022-07-21 ENCOUNTER — Encounter: Payer: Self-pay | Admitting: Gastroenterology

## 2022-08-02 ENCOUNTER — Other Ambulatory Visit (HOSPITAL_BASED_OUTPATIENT_CLINIC_OR_DEPARTMENT_OTHER): Payer: Self-pay

## 2022-08-03 ENCOUNTER — Other Ambulatory Visit (HOSPITAL_BASED_OUTPATIENT_CLINIC_OR_DEPARTMENT_OTHER): Payer: Self-pay

## 2022-08-03 MED ORDER — AMPHETAMINE-DEXTROAMPHET ER 15 MG PO CP24
15.0000 mg | ORAL_CAPSULE | Freq: Every day | ORAL | 0 refills | Status: DC
  Filled 2022-08-03: qty 30, 30d supply, fill #0

## 2022-08-31 ENCOUNTER — Other Ambulatory Visit (HOSPITAL_BASED_OUTPATIENT_CLINIC_OR_DEPARTMENT_OTHER): Payer: Self-pay

## 2022-08-31 MED ORDER — AMPHETAMINE-DEXTROAMPHET ER 15 MG PO CP24
15.0000 mg | ORAL_CAPSULE | Freq: Every morning | ORAL | 0 refills | Status: DC
  Filled 2022-08-31: qty 30, 30d supply, fill #0

## 2022-09-27 ENCOUNTER — Other Ambulatory Visit (HOSPITAL_BASED_OUTPATIENT_CLINIC_OR_DEPARTMENT_OTHER): Payer: Self-pay

## 2022-10-11 ENCOUNTER — Other Ambulatory Visit (HOSPITAL_BASED_OUTPATIENT_CLINIC_OR_DEPARTMENT_OTHER): Payer: Self-pay

## 2022-10-11 MED ORDER — AMPHETAMINE-DEXTROAMPHET ER 15 MG PO CP24
15.0000 mg | ORAL_CAPSULE | Freq: Every morning | ORAL | 0 refills | Status: DC
  Filled 2022-10-11: qty 20, 20d supply, fill #0
  Filled 2022-10-12: qty 10, 10d supply, fill #0

## 2022-10-12 ENCOUNTER — Other Ambulatory Visit: Payer: Self-pay

## 2022-10-12 ENCOUNTER — Other Ambulatory Visit (HOSPITAL_BASED_OUTPATIENT_CLINIC_OR_DEPARTMENT_OTHER): Payer: Self-pay

## 2022-11-09 ENCOUNTER — Other Ambulatory Visit: Payer: Self-pay

## 2022-11-09 ENCOUNTER — Other Ambulatory Visit (HOSPITAL_BASED_OUTPATIENT_CLINIC_OR_DEPARTMENT_OTHER): Payer: Self-pay

## 2022-11-09 MED ORDER — AMPHETAMINE-DEXTROAMPHET ER 15 MG PO CP24
15.0000 mg | ORAL_CAPSULE | Freq: Every morning | ORAL | 0 refills | Status: DC
  Filled 2022-11-09: qty 30, 30d supply, fill #0

## 2022-12-17 ENCOUNTER — Other Ambulatory Visit (HOSPITAL_BASED_OUTPATIENT_CLINIC_OR_DEPARTMENT_OTHER): Payer: Self-pay

## 2022-12-17 MED ORDER — AMPHETAMINE-DEXTROAMPHET ER 15 MG PO CP24
15.0000 mg | ORAL_CAPSULE | Freq: Every morning | ORAL | 0 refills | Status: DC
  Filled 2022-12-17: qty 30, 30d supply, fill #0

## 2023-01-07 ENCOUNTER — Other Ambulatory Visit (HOSPITAL_BASED_OUTPATIENT_CLINIC_OR_DEPARTMENT_OTHER): Payer: Self-pay

## 2023-01-07 MED ORDER — VALSARTAN-HYDROCHLOROTHIAZIDE 320-12.5 MG PO TABS
1.0000 | ORAL_TABLET | Freq: Every day | ORAL | 4 refills | Status: DC
  Filled 2023-01-07: qty 30, 30d supply, fill #0
  Filled 2023-02-24: qty 30, 30d supply, fill #1
  Filled 2023-04-08: qty 30, 30d supply, fill #2
  Filled 2023-05-24: qty 30, 30d supply, fill #3
  Filled 2023-07-12: qty 30, 30d supply, fill #4
  Filled 2023-08-25 (×2): qty 30, 30d supply, fill #5

## 2023-01-12 ENCOUNTER — Other Ambulatory Visit (HOSPITAL_BASED_OUTPATIENT_CLINIC_OR_DEPARTMENT_OTHER): Payer: Self-pay

## 2023-01-17 ENCOUNTER — Other Ambulatory Visit (HOSPITAL_BASED_OUTPATIENT_CLINIC_OR_DEPARTMENT_OTHER): Payer: Self-pay

## 2023-01-17 MED ORDER — AMPHETAMINE-DEXTROAMPHET ER 15 MG PO CP24
15.0000 mg | ORAL_CAPSULE | Freq: Every morning | ORAL | 0 refills | Status: DC
  Filled 2023-01-17: qty 30, 30d supply, fill #0

## 2023-02-22 ENCOUNTER — Other Ambulatory Visit (HOSPITAL_BASED_OUTPATIENT_CLINIC_OR_DEPARTMENT_OTHER): Payer: Self-pay

## 2023-02-22 MED ORDER — AMPHETAMINE-DEXTROAMPHET ER 15 MG PO CP24
15.0000 mg | ORAL_CAPSULE | Freq: Every morning | ORAL | 0 refills | Status: DC
  Filled 2023-02-22: qty 30, 30d supply, fill #0

## 2023-02-24 ENCOUNTER — Other Ambulatory Visit (HOSPITAL_BASED_OUTPATIENT_CLINIC_OR_DEPARTMENT_OTHER): Payer: Self-pay

## 2023-03-28 ENCOUNTER — Other Ambulatory Visit (HOSPITAL_BASED_OUTPATIENT_CLINIC_OR_DEPARTMENT_OTHER): Payer: Self-pay

## 2023-03-28 MED ORDER — AMPHETAMINE-DEXTROAMPHET ER 15 MG PO CP24
16.0000 mg | ORAL_CAPSULE | Freq: Every morning | ORAL | 0 refills | Status: DC
  Filled 2023-03-28: qty 30, 30d supply, fill #0

## 2023-04-07 ENCOUNTER — Other Ambulatory Visit: Payer: Self-pay | Admitting: Internal Medicine

## 2023-04-07 DIAGNOSIS — Z0000 Encounter for general adult medical examination without abnormal findings: Secondary | ICD-10-CM

## 2023-04-08 ENCOUNTER — Other Ambulatory Visit (HOSPITAL_BASED_OUTPATIENT_CLINIC_OR_DEPARTMENT_OTHER): Payer: Self-pay

## 2023-04-27 ENCOUNTER — Ambulatory Visit
Admission: RE | Admit: 2023-04-27 | Discharge: 2023-04-27 | Disposition: A | Discharge status: Home / Self Care | Payer: 59 | Source: Ambulatory Visit | Attending: Internal Medicine | Admitting: Internal Medicine

## 2023-04-27 DIAGNOSIS — Z0000 Encounter for general adult medical examination without abnormal findings: Secondary | ICD-10-CM

## 2023-05-03 ENCOUNTER — Other Ambulatory Visit (HOSPITAL_BASED_OUTPATIENT_CLINIC_OR_DEPARTMENT_OTHER): Payer: Self-pay

## 2023-05-03 MED ORDER — AMPHETAMINE-DEXTROAMPHET ER 15 MG PO CP24
15.0000 mg | ORAL_CAPSULE | Freq: Every morning | ORAL | 0 refills | Status: DC
  Filled 2023-05-03: qty 30, 30d supply, fill #0

## 2023-05-24 ENCOUNTER — Other Ambulatory Visit (HOSPITAL_BASED_OUTPATIENT_CLINIC_OR_DEPARTMENT_OTHER): Payer: Self-pay

## 2023-06-09 ENCOUNTER — Other Ambulatory Visit (HOSPITAL_BASED_OUTPATIENT_CLINIC_OR_DEPARTMENT_OTHER): Payer: Self-pay

## 2023-06-09 MED ORDER — AMPHETAMINE-DEXTROAMPHET ER 15 MG PO CP24
15.0000 mg | ORAL_CAPSULE | Freq: Every morning | ORAL | 0 refills | Status: DC
  Filled 2023-06-09: qty 30, 30d supply, fill #0

## 2023-06-27 ENCOUNTER — Encounter: Payer: 59 | Admitting: Obstetrics and Gynecology

## 2023-06-30 ENCOUNTER — Encounter: Admitting: Obstetrics and Gynecology

## 2023-07-11 ENCOUNTER — Other Ambulatory Visit (HOSPITAL_BASED_OUTPATIENT_CLINIC_OR_DEPARTMENT_OTHER): Payer: Self-pay

## 2023-07-11 MED ORDER — AMPHETAMINE-DEXTROAMPHET ER 15 MG PO CP24
15.0000 mg | ORAL_CAPSULE | Freq: Every morning | ORAL | 0 refills | Status: DC
  Filled 2023-07-11: qty 30, 30d supply, fill #0

## 2023-07-12 ENCOUNTER — Other Ambulatory Visit (HOSPITAL_BASED_OUTPATIENT_CLINIC_OR_DEPARTMENT_OTHER): Payer: Self-pay

## 2023-08-19 ENCOUNTER — Other Ambulatory Visit (HOSPITAL_BASED_OUTPATIENT_CLINIC_OR_DEPARTMENT_OTHER): Payer: Self-pay

## 2023-08-19 MED ORDER — AMPHETAMINE-DEXTROAMPHET ER 15 MG PO CP24
15.0000 mg | ORAL_CAPSULE | Freq: Every morning | ORAL | 0 refills | Status: DC
  Filled 2023-08-19: qty 30, 30d supply, fill #0

## 2023-08-24 NOTE — Progress Notes (Incomplete)
 53 y.o. [id number] female here for annual exam. Married.  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    She reports ***.  Abnormal bleeding: *** Pelvic discharge or pain: *** Breast mass, nipple discharge or skin changes : ***  Sexually active: *** Birth control: Postmenopause Last PAP: No results found for: DIAGPAP, HPVHIGH, ADEQPAP Last mammogram: 04/27/23 Bi-Rads 1 Last colonoscopy:  ***  Exercising: *** Smoker: ***       GYN HISTORY: ***  OB History  Gravida Para Term Preterm AB Living  5 3   2 4   SAB IAB Ectopic Multiple Live Births  2        # Outcome Date GA Lbr Len/2nd Weight Sex Type Anes PTL Lv  5 SAB           4 SAB           3 Para           2 Para           1 Para             Obstetric Comments  1 adopted boy   Past Medical History:  Diagnosis Date   Anemia in pregnancy    Dysphagia    GERD (gastroesophageal reflux disease)    History of UTI    Hypertension in pregnancy    Past Surgical History:  Procedure Laterality Date   DILATION AND CURETTAGE OF UTERUS     ESOPHAGOGASTRODUODENOSCOPY ENDOSCOPY     NSVD     x3, one adopted child   Current Outpatient Medications on File Prior to Visit  Medication Sig Dispense Refill   amoxicillin -clavulanate (AUGMENTIN ) 875-125 MG tablet Take 1 tablet by mouth 2 (two) times daily. 14 tablet 0   amphetamine -dextroamphetamine  (ADDERALL  XR) 10 MG 24 hr capsule 1 capsule by mouth daily 30 capsule 0   amphetamine -dextroamphetamine  (ADDERALL  XR) 15 MG 24 hr capsule Take 1 capsule by mouth every morning. 30 capsule 0   cholecalciferol (VITAMIN D3) 25 MCG (1000 UT) tablet Take 1,000 Units by mouth daily.     FIBER ADULT GUMMIES PO Take 2 tablets by mouth daily.     lisdexamfetamine (VYVANSE ) 20 MG capsule Take 20 mg by mouth daily.     lisdexamfetamine (VYVANSE ) 20 MG capsule TAKE 1 CAPSULE BY MOUTH ONCE DAILY. 30 capsule 0   lisdexamfetamine (VYVANSE ) 20 MG capsule 1 capsule by mouth daily; May be  filled 30 days after date prescribed 30 capsule 0   lisdexamfetamine (VYVANSE ) 20 MG capsule Take 1 capsule by mouth daily 30 capsule 0   lisdexamfetamine (VYVANSE ) 20 MG capsule Take 1 capsule by mouth daily; May be filled 60 days after date prescribed 30 capsule 0   lisdexamfetamine (VYVANSE ) 20 MG capsule Take 1 capsule by mouth daily 30 capsule 0   lisdexamfetamine (VYVANSE ) 20 MG capsule Take 1 capsule by mouth daily 30 capsule 0   lisdexamfetamine (VYVANSE ) 20 MG capsule Take 1 capsule by mouth once daily 30 capsule 0   lisdexamfetamine (VYVANSE ) 20 MG capsule 1 capsule by mouth daily 30 capsule 0   metroNIDAZOLE  (METROGEL ) 0.75 % vaginal gel Place 1 Applicatorful vaginally at bedtime. For 5 nights 70 g 0   Multiple Vitamins-Minerals (MULTIVITAMIN GUMMIES WOMENS) CHEW Chew by mouth.     omeprazole  (PRILOSEC) 20 MG capsule Take 1 capsule (20 mg total) by mouth daily. 30 capsule 11   VALSARTAN  PO Take 1 tablet by mouth daily.     valsartan -hydrochlorothiazide  (DIOVAN  HCT) 320-12.5  MG tablet Take 1 tablet by mouth daily. 90 tablet 4   Current Facility-Administered Medications on File Prior to Visit  Medication Dose Route Frequency Provider Last Rate Last Admin   0.9 %  sodium chloride  infusion  500 mL Intravenous Continuous Aneita Gwendlyn DASEN, MD       0.9 %  sodium chloride  infusion  500 mL Intravenous Continuous Aneita Gwendlyn DASEN, MD       0.9 %  sodium chloride  infusion  500 mL Intravenous Once Aneita Gwendlyn DASEN, MD       Social History   Socioeconomic History   Marital status: Married    Spouse name: Not on file   Number of children: 3   Years of education: Not on file   Highest education level: Not on file  Occupational History   Occupation: At home  Tobacco Use   Smoking status: Never   Smokeless tobacco: Never  Vaping Use   Vaping status: Never Used  Substance and Sexual Activity   Alcohol  use: No   Drug use: No   Sexual activity: Yes    Partners: Male    Birth  control/protection: I.U.D.    Comment: 1st intercourse- 85, partners- 2, married- 24 yrs   Other Topics Concern   Not on file  Social History Narrative   2 caffeine drinks daily    1 adopted son   Social Drivers of Corporate investment banker Strain: Not on file  Food Insecurity: Not on file  Transportation Needs: Not on file  Physical Activity: Not on file  Stress: Not on file  Social Connections: Not on file  Intimate Partner Violence: Not on file   Family History  Problem Relation Age of Onset   Diabetes Father    Kidney disease Maternal Grandfather    Kidney disease Paternal Grandfather    Colon cancer Neg Hx    Colon polyps Neg Hx    Esophageal cancer Neg Hx    Rectal cancer Neg Hx    Stomach cancer Neg Hx    No Known Allergies   PE There were no vitals filed for this visit. There is no height or weight on file to calculate BMI.  Physical Exam    Assessment and Plan:        There are no diagnoses linked to this encounter. Phyllis FORBES Pa, CMA

## 2023-08-25 ENCOUNTER — Other Ambulatory Visit (HOSPITAL_BASED_OUTPATIENT_CLINIC_OR_DEPARTMENT_OTHER): Payer: Self-pay

## 2023-08-31 ENCOUNTER — Encounter: Admitting: Obstetrics and Gynecology

## 2023-09-23 ENCOUNTER — Other Ambulatory Visit (HOSPITAL_BASED_OUTPATIENT_CLINIC_OR_DEPARTMENT_OTHER): Payer: Self-pay

## 2023-09-23 MED ORDER — VALSARTAN-HYDROCHLOROTHIAZIDE 320-12.5 MG PO TABS
1.0000 | ORAL_TABLET | Freq: Every day | ORAL | 4 refills | Status: AC
  Filled 2023-09-23: qty 90, 90d supply, fill #0
  Filled 2024-01-11: qty 90, 90d supply, fill #1

## 2023-09-23 MED ORDER — AMPHETAMINE-DEXTROAMPHET ER 15 MG PO CP24
15.0000 mg | ORAL_CAPSULE | Freq: Every morning | ORAL | 0 refills | Status: DC
  Filled 2023-09-23: qty 30, 30d supply, fill #0

## 2023-10-11 ENCOUNTER — Other Ambulatory Visit (HOSPITAL_BASED_OUTPATIENT_CLINIC_OR_DEPARTMENT_OTHER): Payer: Self-pay | Admitting: Internal Medicine

## 2023-10-11 DIAGNOSIS — E785 Hyperlipidemia, unspecified: Secondary | ICD-10-CM

## 2023-10-26 NOTE — Progress Notes (Signed)
 53 y.o. [id number] female with Mirena IUD (2018) here for new patient annual exam. Married. Minister.  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   She reports vaginal dryness. Fatigue, mood swings, went through early menopause in 30s. ~[age] without a work-up. Desires to have IUD removed,  Mirena IUD x 2018 . Thinks it may have been 10 years since insertion.  +vaginal dryness with IC, not using lubricant  Urine sample provided: No  Abnormal bleeding: none Pelvic discharge or pain: none Breast mass, nipple discharge or skin changes : none  Sexually active: yes Birth control: IUD Last PAP: 09/05/18 Last mammogram: 04/27/23 density B; bi-rads 1 neg Last colonoscopy: never, will f/u with [facility]  Exercising: Yes, attends [employer] camp 6 days a week Smoker: No  Flowsheet Row Office Visit from 10/27/2023 in [facility]  PHQ-2 Total Score 0       GYN HISTORY: No sig hx  OB History  Gravida Para Term Preterm AB Living  5 3 3  2 3   SAB IAB Ectopic Multiple Live Births  2    3    # Outcome Date GA Lbr Len/2nd Weight Sex Type Anes PTL Lv  5 SAB           4 SAB           3 Term      Vag-Spont   LIV  2 Term      Vag-Spont   LIV  1 Term      Vag-Spont   LIV    Obstetric Comments  1 adopted boy   Past Medical History:  Diagnosis Date   Anemia in pregnancy    Dysphagia    GERD (gastroesophageal reflux disease)    History of UTI    Hypertension in pregnancy    Past Surgical History:  Procedure Laterality Date   DILATION AND CURETTAGE OF UTERUS     ESOPHAGOGASTRODUODENOSCOPY ENDOSCOPY     NSVD     x3, one adopted child   Current Outpatient Medications on File Prior to Visit  Medication Sig Dispense Refill   Multiple Vitamins-Minerals (MULTIVITAMIN GUMMIES WOMENS) CHEW Chew by mouth.     valsartan -hydrochlorothiazide  (DIOVAN  HCT) 320-12.5 MG tablet TAKE 1 TABLET BY MOUTH ONCE A DAY 90 tablet 4   No current facility-administered  medications on file prior to visit.   Social History   Socioeconomic History   Marital status: Married    Spouse name: Not on file   Number of children: 3   Years of education: Not on file   Highest education level: Not on file  Occupational History   Occupation: At home  Tobacco Use   Smoking status: Never   Smokeless tobacco: Never  Vaping Use   Vaping status: Never Used  Substance and Sexual Activity   Alcohol  use: No   Drug use: No   Sexual activity: Yes    Partners: Male    Birth control/protection: I.U.D.    Comment: 1st intercourse- 72, partners- 2, married- 24 yrs   Other Topics Concern   Not on file  Social History Narrative   2 caffeine drinks daily    1 adopted son   Social Drivers of Corporate investment banker Strain: Not on file  Food Insecurity: Not on file  Transportation Needs: Not on file  Physical Activity: Not on file  Stress: Not on file  Social Connections: Not on file  Intimate Partner Violence: Not on file  Family History  Problem Relation Age of Onset   Diabetes Father    Kidney disease Maternal Grandfather    Kidney disease Paternal Grandfather    Colon cancer Neg Hx    Colon polyps Neg Hx    Esophageal cancer Neg Hx    Rectal cancer Neg Hx    Stomach cancer Neg Hx    No Known Allergies   PE Today's Vitals   10/27/23 0921  BP: 122/70  Pulse: 80  Temp: 98.8 F (37.1 C)  TempSrc: Oral  SpO2: 98%  Weight: 154 lb (69.9 kg)  Height: 5' 2.5 (1.588 m)   Body mass index is 27.72 kg/m.  Physical Exam Vitals reviewed. Exam conducted with a chaperone present.  Constitutional:      General: She is not in acute distress.    Appearance: Normal appearance.  HENT:     Head: Normocephalic and atraumatic.     Nose: Nose normal.  Eyes:     Extraocular Movements: Extraocular movements intact.     Conjunctiva/sclera: Conjunctivae normal.  Neck:     Thyroid: No thyroid mass, thyromegaly or thyroid tenderness.  Pulmonary:      Effort: Pulmonary effort is normal.  Chest:     Chest wall: No mass or tenderness.  Breasts:    Right: Normal. No swelling, mass, nipple discharge, skin change or tenderness.     Left: Normal. No swelling, mass, nipple discharge, skin change or tenderness.  Abdominal:     General: There is no distension.     Palpations: Abdomen is soft.     Tenderness: There is no abdominal tenderness.  Genitourinary:    General: Normal vulva.     Exam position: Lithotomy position.     Urethra: No prolapse.     Vagina: Normal. No vaginal discharge or bleeding.     Cervix: Normal. No lesion.     Uterus: Normal. Not enlarged and not tender.      Adnexa: Right adnexa normal and left adnexa normal.  Musculoskeletal:        General: Normal range of motion.     Cervical back: Normal range of motion.  Lymphadenopathy:     Upper Body:     Right upper body: No axillary adenopathy.     Left upper body: No axillary adenopathy.     Lower Body: No right inguinal adenopathy. No left inguinal adenopathy.  Skin:    General: Skin is warm and dry.  Neurological:     General: No focal deficit present.     Mental Status: She is alert.  Psychiatric:        Mood and Affect: Mood normal.        Behavior: Behavior normal.       Assessment and Plan:        Well woman exam with routine gynecological exam Assessment & Plan: Cervical cancer screening performed according to ASCCP guidelines. Encouraged annual mammogram screening Colonoscopy never, f/u with PCP DXA consider pending hormonal w/u Labs and immunizations with her primary Encouraged safe sexual practices as indicated Encouraged healthy lifestyle practices with diet and exercise For patients under 50-70yo, I recommend 1200mg  calcium daily and 600IU of vitamin D  daily.    Cervical cancer screening -     Cytology - PAP  Negative depression screening  Other fatigue -     VITAMIN D  25 Hydroxy (Vit-D Deficiency, Fractures)  Perimenopausal  symptom Assessment & Plan: Early menopause in her 30s with unclear etiology IUD placed for contraception  in 2018 Will check hormones Return to office in 2 weeks for management discussion of possible IUD removal Given early menopause, discussed that I would recommend starting with nonhormonal options for menopause support if needed  Orders: -     Estradiol  -     Follicle stimulating hormone  Uses hormone releasing intrauterine device (IUD) for contraception -     IUD removal; Future   Vera LULLA Pa, MD

## 2023-10-27 ENCOUNTER — Ambulatory Visit (INDEPENDENT_AMBULATORY_CARE_PROVIDER_SITE_OTHER): Admitting: Obstetrics and Gynecology

## 2023-10-27 ENCOUNTER — Other Ambulatory Visit (HOSPITAL_COMMUNITY)
Admission: RE | Admit: 2023-10-27 | Discharge: 2023-10-27 | Disposition: A | Discharge status: Home / Self Care | Source: Ambulatory Visit | Attending: Obstetrics and Gynecology | Admitting: Obstetrics and Gynecology

## 2023-10-27 ENCOUNTER — Other Ambulatory Visit (HOSPITAL_BASED_OUTPATIENT_CLINIC_OR_DEPARTMENT_OTHER): Payer: Self-pay

## 2023-10-27 ENCOUNTER — Encounter: Payer: Self-pay | Admitting: Obstetrics and Gynecology

## 2023-10-27 VITALS — BP 122/70 | HR 80 | Temp 98.8°F | Ht 62.5 in | Wt 154.0 lb

## 2023-10-27 DIAGNOSIS — Z124 Encounter for screening for malignant neoplasm of cervix: Secondary | ICD-10-CM | POA: Diagnosis present

## 2023-10-27 DIAGNOSIS — Z01419 Encounter for gynecological examination (general) (routine) without abnormal findings: Secondary | ICD-10-CM | POA: Insufficient documentation

## 2023-10-27 DIAGNOSIS — Z1331 Encounter for screening for depression: Secondary | ICD-10-CM | POA: Diagnosis not present

## 2023-10-27 DIAGNOSIS — Z975 Presence of (intrauterine) contraceptive device: Secondary | ICD-10-CM | POA: Insufficient documentation

## 2023-10-27 DIAGNOSIS — R5383 Other fatigue: Secondary | ICD-10-CM | POA: Diagnosis not present

## 2023-10-27 DIAGNOSIS — N951 Menopausal and female climacteric states: Secondary | ICD-10-CM | POA: Insufficient documentation

## 2023-10-27 MED ORDER — AMPHETAMINE-DEXTROAMPHET ER 15 MG PO CP24
15.0000 mg | ORAL_CAPSULE | Freq: Every morning | ORAL | 0 refills | Status: DC
  Filled 2023-10-27: qty 30, 30d supply, fill #0

## 2023-10-27 NOTE — Assessment & Plan Note (Signed)
 Cervical cancer screening performed according to ASCCP guidelines. Encouraged annual mammogram screening Colonoscopy never, f/u with PCP DXA consider pending hormonal w/u Labs and immunizations with her primary Encouraged safe sexual practices as indicated Encouraged healthy lifestyle practices with diet and exercise For patients under 50-51yo, I recommend 1200mg  calcium daily and 600IU of vitamin D  daily.

## 2023-10-27 NOTE — Assessment & Plan Note (Addendum)
 Early menopause in her 30s with unclear etiology IUD placed for contraception in 2018 Will check hormones Return to office in 2 weeks for management discussion of possible IUD removal Given early menopause, discussed that I would recommend starting with nonhormonal options for menopause support if needed

## 2023-10-27 NOTE — Patient Instructions (Signed)

## 2023-10-28 ENCOUNTER — Ambulatory Visit (HOSPITAL_BASED_OUTPATIENT_CLINIC_OR_DEPARTMENT_OTHER)
Admission: RE | Admit: 2023-10-28 | Discharge: 2023-10-28 | Disposition: A | Discharge status: Home / Self Care | Payer: Self-pay | Source: Ambulatory Visit | Attending: Internal Medicine | Admitting: Internal Medicine

## 2023-10-28 ENCOUNTER — Ambulatory Visit (HOSPITAL_BASED_OUTPATIENT_CLINIC_OR_DEPARTMENT_OTHER): Payer: Self-pay | Admitting: Obstetrics and Gynecology

## 2023-10-28 DIAGNOSIS — E785 Hyperlipidemia, unspecified: Secondary | ICD-10-CM | POA: Insufficient documentation

## 2023-10-28 LAB — VITAMIN D 25 HYDROXY (VIT D DEFICIENCY, FRACTURES): Vit D, 25-Hydroxy: 52 ng/mL (ref 30–100)

## 2023-10-28 LAB — FOLLICLE STIMULATING HORMONE: FSH: 78.6 m[IU]/mL

## 2023-10-28 LAB — ESTRADIOL: Estradiol: 27 pg/mL

## 2023-10-31 LAB — CYTOLOGY - PAP
Comment: NEGATIVE
Diagnosis: NEGATIVE
High risk HPV: NEGATIVE

## 2023-11-22 ENCOUNTER — Ambulatory Visit: Admitting: Obstetrics and Gynecology

## 2023-11-30 ENCOUNTER — Other Ambulatory Visit (HOSPITAL_BASED_OUTPATIENT_CLINIC_OR_DEPARTMENT_OTHER): Payer: Self-pay

## 2023-11-30 ENCOUNTER — Other Ambulatory Visit: Payer: Self-pay

## 2023-11-30 MED ORDER — AMPHETAMINE-DEXTROAMPHET ER 15 MG PO CP24
15.0000 mg | ORAL_CAPSULE | Freq: Every morning | ORAL | 0 refills | Status: DC
  Filled 2023-11-30: qty 30, 30d supply, fill #0

## 2023-12-07 ENCOUNTER — Ambulatory Visit: Admitting: Obstetrics and Gynecology

## 2023-12-07 ENCOUNTER — Encounter: Payer: Self-pay | Admitting: Obstetrics and Gynecology

## 2023-12-07 VITALS — BP 122/84 | HR 103 | Temp 98.7°F | Wt 154.0 lb

## 2023-12-07 DIAGNOSIS — Z975 Presence of (intrauterine) contraceptive device: Secondary | ICD-10-CM

## 2023-12-07 DIAGNOSIS — Z30432 Encounter for removal of intrauterine contraceptive device: Secondary | ICD-10-CM | POA: Diagnosis not present

## 2023-12-07 NOTE — Progress Notes (Signed)
 53 y.o. [id number] female with Mirena IUD (2018) here for IUD removal. Married. Minister.  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   Desires to have IUD removed,  Mirena IUD x 2018 . Thinks it may have been 10 years since insertion.  +vaginal dryness with IC, not using lubricant 10/27/23 FSH elevated  GYN HISTORY: No sig hx  OB History  Gravida Para Term Preterm AB Living  5 3 3  2 3   SAB IAB Ectopic Multiple Live Births  2    3    # Outcome Date GA Lbr Len/2nd Weight Sex Type Anes PTL Lv  5 SAB           4 SAB           3 Term      Vag-Spont   LIV  2 Term      Vag-Spont   LIV  1 Term      Vag-Spont   LIV    Obstetric Comments  1 adopted boy   Past Medical History:  Diagnosis Date   Anemia in pregnancy    Dysphagia    GERD (gastroesophageal reflux disease)    History of UTI    Hypertension in pregnancy    Past Surgical History:  Procedure Laterality Date   DILATION AND CURETTAGE OF UTERUS     ESOPHAGOGASTRODUODENOSCOPY ENDOSCOPY     NSVD     x3, one adopted child   Current Outpatient Medications on File Prior to Visit  Medication Sig Dispense Refill   amphetamine -dextroamphetamine  (ADDERALL  XR) 15 MG 24 hr capsule Take 1 capsule by mouth every morning. 30 capsule 0   Multiple Vitamins-Minerals (MULTIVITAMIN GUMMIES WOMENS) CHEW Chew by mouth.     valsartan -hydrochlorothiazide  (DIOVAN  HCT) 320-12.5 MG tablet TAKE 1 TABLET BY MOUTH ONCE A DAY 90 tablet 4   No current facility-administered medications on file prior to visit.   No Known Allergies   PE Today's Vitals   12/07/23 1400  BP: 122/84  Pulse: (!) 103  Temp: 98.7 F (37.1 C)  TempSrc: Oral  SpO2: 98%  Weight: 154 lb (69.9 kg)   Body mass index is 27.72 kg/m.  Physical Exam Vitals reviewed. Exam conducted with a chaperone present.  Constitutional:      General: She is not in acute distress.    Appearance: Normal appearance.  HENT:     Head: Normocephalic and atraumatic.     Nose: Nose  normal.  Eyes:     Extraocular Movements: Extraocular movements intact.     Conjunctiva/sclera: Conjunctivae normal.  Pulmonary:     Effort: Pulmonary effort is normal.  Genitourinary:    General: Normal vulva.     Exam position: Lithotomy position.     Vagina: Normal. No vaginal discharge.     Cervix: Normal. No cervical motion tenderness, discharge or lesion.     Uterus: Normal. Not enlarged and not tender.      Adnexa: Right adnexa normal and left adnexa normal.     Comments: IUD strings present. Musculoskeletal:        General: Normal range of motion.     Cervical back: Normal range of motion.  Neurological:     General: No focal deficit present.     Mental Status: She is alert.  Psychiatric:        Mood and Affect: Mood normal.        Behavior: Behavior normal.     IUD removal procedure: Consent was signed. Timeout was performed.  Speculum inserted. Cervix visualized, IUD strings were grasped with forceps. Strings were noted to be completely detached from IUD prior to application of traction. Knot was identified at top of the strings.  Offered attempt at blind IUD retrieval vs hysteroscopy with IUD removal. She opted for hysteroscopic procedure. Procedure was discontinued at this time. Pt tolerated procedure well.     Assessment and Plan:        Attempted IUD removal, unsuccessful -     Ambulatory Referral For Surgery Scheduling  Plan for diagnostic hysteroscopy and IUD removal. Denies surgical or anesthetic complications with prior D&C or endoscopy  Discussed outpatient procedure. Reviewed that  recovery is usually 1-2 days. Risks including infections, bleeding, and damage to surrounding organs reviewed. Recommend NPO prior to midnight and reviewed medication to take on day of surgery. Dicussed use of NSAIDS as needed for pain postoperatively.  Preop checklist: Antibiotics: none DVT ppx: SCDs Postop visit: 1 week Additional clearance: none, well-controlled  hypertension on single agent, 10/28/23 coronary calcium score 0  Vera LULLA Pa, MD

## 2024-01-02 ENCOUNTER — Other Ambulatory Visit: Payer: Self-pay

## 2024-01-02 ENCOUNTER — Other Ambulatory Visit (HOSPITAL_BASED_OUTPATIENT_CLINIC_OR_DEPARTMENT_OTHER): Payer: Self-pay

## 2024-01-02 MED ORDER — AMPHETAMINE-DEXTROAMPHET ER 15 MG PO CP24
15.0000 mg | ORAL_CAPSULE | Freq: Every morning | ORAL | 0 refills | Status: DC
  Filled 2024-01-02: qty 30, 30d supply, fill #0

## 2024-01-11 ENCOUNTER — Other Ambulatory Visit (HOSPITAL_BASED_OUTPATIENT_CLINIC_OR_DEPARTMENT_OTHER): Payer: Self-pay

## 2024-01-13 ENCOUNTER — Other Ambulatory Visit (HOSPITAL_BASED_OUTPATIENT_CLINIC_OR_DEPARTMENT_OTHER): Payer: Self-pay

## 2024-02-03 ENCOUNTER — Other Ambulatory Visit (HOSPITAL_BASED_OUTPATIENT_CLINIC_OR_DEPARTMENT_OTHER): Payer: Self-pay

## 2024-02-03 ENCOUNTER — Other Ambulatory Visit: Payer: Self-pay

## 2024-02-03 MED ORDER — AMPHETAMINE-DEXTROAMPHET ER 15 MG PO CP24
15.0000 mg | ORAL_CAPSULE | Freq: Every day | ORAL | 0 refills | Status: DC
  Filled 2024-02-03: qty 30, 30d supply, fill #0

## 2024-02-13 ENCOUNTER — Encounter: Payer: Self-pay | Admitting: *Deleted

## 2024-03-05 ENCOUNTER — Other Ambulatory Visit (HOSPITAL_BASED_OUTPATIENT_CLINIC_OR_DEPARTMENT_OTHER): Payer: Self-pay

## 2024-03-05 MED ORDER — AMPHETAMINE-DEXTROAMPHET ER 15 MG PO CP24
15.0000 mg | ORAL_CAPSULE | Freq: Every morning | ORAL | 0 refills | Status: AC
  Filled 2024-03-05: qty 30, 30d supply, fill #0

## 2024-03-14 NOTE — Progress Notes (Unsigned)
 "               Phyllis Rogers [facility] [address] [place] [address] Phone: [phone]   Assessment and Plan:     1. Chronic left shoulder pain (Primary) 2. Subacromial bursitis of left shoulder joint -Chronic with exacerbation, initial sports medicine visit - Left shoulder pain for 3+, worsening over the past 3+ weeks most consistent with rotator cuff strain versus subacromial bursitis likely due to physical activity at burn [facility] - Patient elected for subacromial CSI.  Tolerated well per note below - If no significant improvement in 2 weeks, recommend patient reach out to our clinic and we can prescribe meloxicam course for 2 to 3 weeks - May use OTC NSAIDs/Tylenol as needed for pain relief - Start HEP for shoulder/rotator cuff  Procedure: Subacromial Injection Side: Left  Risks explained and consent was given verbally. The site was cleaned with alcohol  prep. A steroid injection was performed from posterior approach using 2mL of 1% lidocaine without epinephrine and 1mL of kenalog 40mg /ml. This was well tolerated.  Needle was removed, hemostasis achieved, and post injection instructions were explained.   Pt was advised to call or return to clinic if these symptoms worsen or fail to improve as anticipated.    15 additional minutes spent for educating Therapeutic Home Exercise Program.  This included exercises focusing on stretching, strengthening, with focus on eccentric aspects.   Long term goals include an improvement in range of motion, strength, endurance as well as avoiding reinjury. Patient's frequency would include in 1-2 times a day, 3-5 times a week for a duration of 6-12 weeks. Proper technique shown and discussed handout in great detail with ATC.  All questions were discussed and answered.    Pertinent previous records reviewed include none   Follow Up: Call in 2 weeks and if no significant improvement, could prescribe meloxicam course.   Follow-up in clinic in 4 weeks for reevaluation.  If no improvement or worsening of symptoms, could consider ultrasound versus x-ray versus physical therapy   Subjective:   I, Phyllis Rogers, am serving as a neurosurgeon for Doctor Phyllis Rogers  Chief Complaint: left shoulder pain   HPI:   03/15/2024 Patient is a 54 year old female with left shoulder pain. Patient states pain started a couple of months ago. Pain has increased over the past 3 weeks. Thinks she injured it working out. Pain is in the shoulder and radiates down the arm.she endorses a popping sensation that is painful in nature. No numbness or tingling. Ibu 2x a day does not help. Pain is worst at night . Decreased ROM. She notes she is a side sleeper.    Relevant Historical Information: None pertinent  Additional pertinent review of systems negative.  Current Medications[1]   Objective:     Vitals:   03/15/24 1311  BP: 120/76  Pulse: 91  SpO2: 96%  Weight: 156 lb (70.8 kg)  Height: 5' 2 (1.575 m)      Body mass index is 28.53 kg/m.    Physical Exam:    Gen: Appears well, nad, nontoxic and pleasant Neuro:sensation intact, strength is 5/5, muscle tone wnl Skin: no suspicious lesion or defmority Psych: A&O, appropriate mood and affect  Left shoulder:  No deformity, swelling or muscle wasting No scapular winging FF 170, abd 170, int 10, ext 80 with pain in all directions TTP AC NTTP over the [place], clavicle,   coracoid, biceps groove, humerus,  deltoid, trapezius, cervical spine Positive neer, hawkins,  obriens, crossarm, Negative empty can,subscap liftoff, speeds Neg ant drawer, sulcus sign, apprehension Negative Spurling's test bilat FROM of neck    Electronically signed by:  Phyllis Rogers [facility] 1:34 PM 03/15/24       [1]  Current Outpatient Medications:    amphetamine -dextroamphetamine  (ADDERALL  XR) 15 MG 24 hr capsule, Take 1 capsule by mouth in the morning., Disp: 30  capsule, Rfl: 0   Multiple Vitamins-Minerals (MULTIVITAMIN GUMMIES WOMENS) CHEW, Chew by mouth., Disp: , Rfl:    valsartan -hydrochlorothiazide  (DIOVAN  HCT) 320-12.5 MG tablet, TAKE 1 TABLET BY MOUTH ONCE A DAY, Disp: 90 tablet, Rfl: 4  "

## 2024-03-15 ENCOUNTER — Ambulatory Visit: Admitting: Sports Medicine

## 2024-03-15 VITALS — BP 120/76 | HR 91 | Ht 62.0 in | Wt 156.0 lb

## 2024-03-15 DIAGNOSIS — G8929 Other chronic pain: Secondary | ICD-10-CM

## 2024-03-15 DIAGNOSIS — M7552 Bursitis of left shoulder: Secondary | ICD-10-CM | POA: Diagnosis not present

## 2024-03-15 DIAGNOSIS — M25512 Pain in left shoulder: Secondary | ICD-10-CM

## 2024-03-15 NOTE — Patient Instructions (Addendum)
 Shoulder HEP   If no signification difference in 2 weeks call and ask for a meloxicam prescription    4 week follow up

## 2024-03-16 ENCOUNTER — Ambulatory Visit (INDEPENDENT_AMBULATORY_CARE_PROVIDER_SITE_OTHER): Admitting: Obstetrics and Gynecology

## 2024-03-16 ENCOUNTER — Encounter: Payer: Self-pay | Admitting: Obstetrics and Gynecology

## 2024-03-16 VITALS — BP 122/84 | HR 82 | Temp 98.1°F | Ht 62.0 in | Wt 154.0 lb

## 2024-03-16 DIAGNOSIS — Z30432 Encounter for removal of intrauterine contraceptive device: Secondary | ICD-10-CM

## 2024-03-16 DIAGNOSIS — Z01818 Encounter for other preprocedural examination: Secondary | ICD-10-CM

## 2024-03-16 NOTE — H&P (View-Only) (Signed)
 "  54 y.o. [id number] female with with Mirena IUD (2018) here for preoperative exam for diagnostic hysteroscopy and IUD removal on 03/28/2024. Married. Minister.  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   10/27/23 FSH 78 No CP or SOB. HTN well controlled on combined agent  OB History  Gravida Para Term Preterm AB Living  5 3 3  2 3   SAB IAB Ectopic Multiple Live Births  2    3    # Outcome Date GA Lbr Len/2nd Weight Sex Type Anes PTL Lv  5 SAB           4 SAB           3 Term      Vag-Spont   LIV  2 Term      Vag-Spont   LIV  1 Term      Vag-Spont   LIV    Obstetric Comments  1 adopted boy    Past Medical History:  Diagnosis Date   Anemia in pregnancy    Dysphagia    GERD (gastroesophageal reflux disease)    History of UTI    Hypertension in pregnancy     Past Surgical History:  Procedure Laterality Date   DILATION AND CURETTAGE OF UTERUS     ESOPHAGOGASTRODUODENOSCOPY ENDOSCOPY     NSVD     x3, one adopted child    Medications Ordered Prior to Encounter[1]  Allergies[2]    PE Today's Vitals   03/16/24 0905  BP: 122/84  Pulse: 82  Temp: 98.1 F (36.7 C)  TempSrc: Oral  SpO2: 99%  Weight: 154 lb (69.9 kg)  Height: 5' 2 (1.575 m)   Body mass index is 28.17 kg/m.  Physical Exam Vitals reviewed.  Constitutional:      General: She is not in acute distress.    Appearance: Normal appearance.  HENT:     Head: Normocephalic and atraumatic.     Nose: Nose normal.  Eyes:     Extraocular Movements: Extraocular movements intact.     Conjunctiva/sclera: Conjunctivae normal.  Cardiovascular:     Rate and Rhythm: Normal rate and regular rhythm.     Heart sounds: No murmur heard.    No friction rub. No gallop.  Pulmonary:     Effort: Pulmonary effort is normal. No respiratory distress.     Breath sounds: Normal breath sounds. No stridor. No wheezing, rhonchi or rales.  Musculoskeletal:        General: Normal range of motion.     Cervical back:  Normal range of motion.  Neurological:     General: No focal deficit present.     Mental Status: She is alert.  Psychiatric:        Mood and Affect: Mood normal.        Behavior: Behavior normal.       Assessment and Plan:        Preop examination  Attempted IUD removal, unsuccessful  Plan for diagnostic hysteroscopy and IUD removal.   Discussed outpatient procedure. Reviewed that  recovery is usually 1-2 days. Risks including infections, bleeding, and damage to surrounding organs reviewed. Recommend NPO prior to midnight and reviewed medication to take on day of surgery. Discussed use of NSAIDS as needed for pain postoperatively.   Preop checklist: Antibiotics: none DVT ppx: SCDs Postop visit: 1 week Additional clearance: none, well-controlled hypertension on single agent, 10/28/23 coronary calcium score 0  19 min  total time was spent for this patient encounter, including preparation,  face-to-face counseling with the patient, coordination of care, and documentation of the encounter.   Vera LULLA Pa, MD     [1]  Current Outpatient Medications on File Prior to Visit  Medication Sig Dispense Refill   amphetamine -dextroamphetamine  (ADDERALL  XR) 15 MG 24 hr capsule Take 1 capsule by mouth in the morning. 30 capsule 0   Multiple Vitamins-Minerals (MULTIVITAMIN GUMMIES WOMENS) CHEW Chew by mouth.     valsartan -hydrochlorothiazide  (DIOVAN  HCT) 320-12.5 MG tablet TAKE 1 TABLET BY MOUTH ONCE A DAY 90 tablet 4   No current facility-administered medications on file prior to visit.  [2] No Known Allergies  "

## 2024-03-16 NOTE — Progress Notes (Signed)
 "  54 y.o. [id number] female with with Mirena IUD (2018) here for preoperative exam for diagnostic hysteroscopy and IUD removal on 03/28/2024. Married. Minister.  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   10/27/23 FSH 78 No CP or SOB. HTN well controlled on combined agent  OB History  Gravida Para Term Preterm AB Living  5 3 3  2 3   SAB IAB Ectopic Multiple Live Births  2    3    # Outcome Date GA Lbr Len/2nd Weight Sex Type Anes PTL Lv  5 SAB           4 SAB           3 Term      Vag-Spont   LIV  2 Term      Vag-Spont   LIV  1 Term      Vag-Spont   LIV    Obstetric Comments  1 adopted boy    Past Medical History:  Diagnosis Date   Anemia in pregnancy    Dysphagia    GERD (gastroesophageal reflux disease)    History of UTI    Hypertension in pregnancy     Past Surgical History:  Procedure Laterality Date   DILATION AND CURETTAGE OF UTERUS     ESOPHAGOGASTRODUODENOSCOPY ENDOSCOPY     NSVD     x3, one adopted child    Medications Ordered Prior to Encounter[1]  Allergies[2]    PE Today's Vitals   03/16/24 0905  BP: 122/84  Pulse: 82  Temp: 98.1 F (36.7 C)  TempSrc: Oral  SpO2: 99%  Weight: 154 lb (69.9 kg)  Height: 5' 2 (1.575 m)   Body mass index is 28.17 kg/m.  Physical Exam Vitals reviewed.  Constitutional:      General: She is not in acute distress.    Appearance: Normal appearance.  HENT:     Head: Normocephalic and atraumatic.     Nose: Nose normal.  Eyes:     Extraocular Movements: Extraocular movements intact.     Conjunctiva/sclera: Conjunctivae normal.  Cardiovascular:     Rate and Rhythm: Normal rate and regular rhythm.     Heart sounds: No murmur heard.    No friction rub. No gallop.  Pulmonary:     Effort: Pulmonary effort is normal. No respiratory distress.     Breath sounds: Normal breath sounds. No stridor. No wheezing, rhonchi or rales.  Musculoskeletal:        General: Normal range of motion.     Cervical back:  Normal range of motion.  Neurological:     General: No focal deficit present.     Mental Status: She is alert.  Psychiatric:        Mood and Affect: Mood normal.        Behavior: Behavior normal.       Assessment and Plan:        Preop examination  Attempted IUD removal, unsuccessful  Plan for diagnostic hysteroscopy and IUD removal.   Discussed outpatient procedure. Reviewed that  recovery is usually 1-2 days. Risks including infections, bleeding, and damage to surrounding organs reviewed. Recommend NPO prior to midnight and reviewed medication to take on day of surgery. Discussed use of NSAIDS as needed for pain postoperatively.   Preop checklist: Antibiotics: none DVT ppx: SCDs Postop visit: 1 week Additional clearance: none, well-controlled hypertension on single agent, 10/28/23 coronary calcium score 0  19 min  total time was spent for this patient encounter, including preparation,  face-to-face counseling with the patient, coordination of care, and documentation of the encounter.   Vera LULLA Pa, MD     [1]  Current Outpatient Medications on File Prior to Visit  Medication Sig Dispense Refill   amphetamine -dextroamphetamine  (ADDERALL  XR) 15 MG 24 hr capsule Take 1 capsule by mouth in the morning. 30 capsule 0   Multiple Vitamins-Minerals (MULTIVITAMIN GUMMIES WOMENS) CHEW Chew by mouth.     valsartan -hydrochlorothiazide  (DIOVAN  HCT) 320-12.5 MG tablet TAKE 1 TABLET BY MOUTH ONCE A DAY 90 tablet 4   No current facility-administered medications on file prior to visit.  [2] No Known Allergies  "

## 2024-03-16 NOTE — Patient Instructions (Addendum)
 Preoperative instructions: Nothing to eat or drink after midnight, unless instructed differently regarding clear liquids by the anesthesia team at [facility]. Do not take any medications on the day of surgery, except those listed below: Diovan  Please follow all other instructions as provided by our surgical scheduler at [facility] and the anesthesia team at [facility] health.  Postoperative instructions: Take ibuprofen as prescribed and over-the-counter Tylenol as needed. (DO NOT TAKE IBUPROFEN IF YOU HAVE CONTRAINDICATIONS THAT MAY INCLUDE USE OF BLOOD THINNERS, HISTORY STOMACH SURGERY OR GASTRITIS, CHRONIC KIDNEY DISEASE, ALLERGY, PRIOR INSTRUCTION TO AVOID IBUPROFEN-LIKE MEDICATIONS.)

## 2024-03-20 ENCOUNTER — Encounter: Payer: Self-pay | Admitting: *Deleted

## 2024-03-27 ENCOUNTER — Encounter (HOSPITAL_COMMUNITY): Payer: Self-pay | Admitting: Obstetrics and Gynecology

## 2024-03-27 ENCOUNTER — Encounter (HOSPITAL_COMMUNITY): Payer: Self-pay

## 2024-03-27 NOTE — Progress Notes (Addendum)
 ADDENDUM: patient called back. Given arrival time and pt to take no meds. Driver will be husband Phyllis Rogers.            Spoke w/ via phone for pre-op interview---Unable to reach instructions left on voicemail and sent to pt mychart. Lab needs dos----UPT per surgeon. BMP and EKG per anesthesia.         Lab results------ COVID test -----patient states asymptomatic no test needed Arrive at -------1130 NPO after MN NO Solid Food.  Clear liquids from MN until---1030 Pre-Surgery Ensure or G2:  Med rec completed Medications to take morning of surgery -----NONE Diabetic medication -----  GLP1 agonist last dose: GLP1 instructions:  Patient instructed no nail polish to be worn day of surgery Patient instructed to bring photo id and insurance card day of surgery Patient aware to have Driver (ride ) / caregiver    for 24 hours after surgery - Unknown unable to reach pt. Patient Special Instructions ----- Pre-Op special Instructions -----  Patient verbalized understanding of instructions that were given at this phone interview. Patient denies chest pain, sob, fever, cough at the interview.

## 2024-03-28 ENCOUNTER — Other Ambulatory Visit: Payer: Self-pay

## 2024-03-28 ENCOUNTER — Encounter (HOSPITAL_COMMUNITY): Payer: Self-pay | Admitting: Anesthesiology

## 2024-03-28 ENCOUNTER — Encounter (HOSPITAL_COMMUNITY): Admission: RE | Disposition: A | Payer: Self-pay | Source: Home / Self Care | Attending: Obstetrics and Gynecology

## 2024-03-28 ENCOUNTER — Ambulatory Visit (HOSPITAL_COMMUNITY)
Admission: RE | Admit: 2024-03-28 | Discharge: 2024-03-28 | Disposition: A | Source: Home / Self Care | Attending: Obstetrics and Gynecology | Admitting: Obstetrics and Gynecology

## 2024-03-28 ENCOUNTER — Encounter (HOSPITAL_COMMUNITY): Payer: Self-pay | Admitting: Obstetrics and Gynecology

## 2024-03-28 DIAGNOSIS — Z975 Presence of (intrauterine) contraceptive device: Secondary | ICD-10-CM

## 2024-03-28 DIAGNOSIS — Z30432 Encounter for removal of intrauterine contraceptive device: Secondary | ICD-10-CM | POA: Diagnosis not present

## 2024-03-28 DIAGNOSIS — I10 Essential (primary) hypertension: Secondary | ICD-10-CM

## 2024-03-28 LAB — BASIC METABOLIC PANEL WITH GFR
Anion gap: 12 (ref 5–15)
BUN: 20 mg/dL (ref 6–20)
CO2: 26 mmol/L (ref 22–32)
Calcium: 9.2 mg/dL (ref 8.9–10.3)
Chloride: 101 mmol/L (ref 98–111)
Creatinine, Ser: 0.84 mg/dL (ref 0.44–1.00)
GFR, Estimated: >60 mL/min
Glucose, Bld: 79 mg/dL (ref 70–99)
Potassium: 4.5 mmol/L (ref 3.5–5.1)
Sodium: 138 mmol/L (ref 135–145)

## 2024-03-28 LAB — POCT PREGNANCY, URINE: Preg Test, Ur: NEGATIVE

## 2024-03-28 MED ORDER — FENTANYL CITRATE (PF) 100 MCG/2ML IJ SOLN
INTRAMUSCULAR | Status: AC
  Filled 2024-03-28: qty 2

## 2024-03-28 MED ORDER — CHLORHEXIDINE GLUCONATE 0.12 % MT SOLN
OROMUCOSAL | Status: AC
  Filled 2024-03-28: qty 15

## 2024-03-28 MED ORDER — PHENYLEPHRINE 80 MCG/ML (10ML) SYRINGE FOR IV PUSH (FOR BLOOD PRESSURE SUPPORT)
PREFILLED_SYRINGE | INTRAVENOUS | Status: DC | PRN
  Administered 2024-03-28: 80 ug via INTRAVENOUS
  Administered 2024-03-28: 160 ug via INTRAVENOUS
  Administered 2024-03-28: 80 ug via INTRAVENOUS

## 2024-03-28 MED ORDER — OXYCODONE HCL 5 MG/5ML PO SOLN: 5.0000 mg | Freq: Once | ORAL | Status: DC | PRN

## 2024-03-28 MED ORDER — AMISULPRIDE (ANTIEMETIC) 5 MG/2ML IV SOLN: 10.0000 mg | Freq: Once | INTRAVENOUS | Status: DC | PRN

## 2024-03-28 MED ORDER — MIDAZOLAM HCL (PF) 2 MG/2ML IJ SOLN
INTRAMUSCULAR | Status: DC | PRN
  Administered 2024-03-28: 2 mg via INTRAVENOUS

## 2024-03-28 MED ORDER — OXYCODONE HCL 5 MG PO TABS: 5.0000 mg | ORAL_TABLET | Freq: Once | ORAL | Status: DC | PRN

## 2024-03-28 MED ORDER — DEXAMETHASONE SOD PHOSPHATE PF 10 MG/ML IJ SOLN
INTRAMUSCULAR | Status: DC | PRN
  Administered 2024-03-28: 10 mg via INTRAVENOUS

## 2024-03-28 MED ORDER — ACETAMINOPHEN 500 MG PO TABS
ORAL_TABLET | ORAL | Status: AC
  Filled 2024-03-28: qty 2

## 2024-03-28 MED ORDER — CHLORHEXIDINE GLUCONATE 0.12 % MT SOLN
15.0000 mL | Freq: Once | OROMUCOSAL | Status: AC
  Administered 2024-03-28: 15 mL via OROMUCOSAL

## 2024-03-28 MED ORDER — FENTANYL CITRATE (PF) 100 MCG/2ML IJ SOLN: 25.0000 ug | INTRAMUSCULAR | Status: DC | PRN

## 2024-03-28 MED ORDER — ONDANSETRON HCL 4 MG/2ML IJ SOLN
INTRAMUSCULAR | Status: DC | PRN
  Administered 2024-03-28: 4 mg via INTRAVENOUS

## 2024-03-28 MED ORDER — CELECOXIB 200 MG PO CAPS
400.0000 mg | ORAL_CAPSULE | ORAL | Status: AC
  Administered 2024-03-28: 400 mg via ORAL

## 2024-03-28 MED ORDER — ORAL CARE MOUTH RINSE: 15.0000 mL | Freq: Once | OROMUCOSAL | Status: AC

## 2024-03-28 MED ORDER — LACTATED RINGERS IV SOLN: INTRAVENOUS | Status: DC

## 2024-03-28 MED ORDER — DEXAMETHASONE SOD PHOSPHATE PF 10 MG/ML IJ SOLN
INTRAMUSCULAR | Status: AC
  Filled 2024-03-28: qty 1

## 2024-03-28 MED ORDER — CELECOXIB 200 MG PO CAPS
ORAL_CAPSULE | ORAL | Status: AC
  Filled 2024-03-28: qty 2

## 2024-03-28 MED ORDER — SCOPOLAMINE 1 MG/3DAYS TD PT72
MEDICATED_PATCH | TRANSDERMAL | Status: AC
  Filled 2024-03-28: qty 1

## 2024-03-28 MED ORDER — KETOROLAC TROMETHAMINE 30 MG/ML IJ SOLN
INTRAMUSCULAR | Status: AC
  Filled 2024-03-28: qty 1

## 2024-03-28 MED ORDER — PROPOFOL 10 MG/ML IV BOLUS
INTRAVENOUS | Status: AC
  Filled 2024-03-28: qty 20

## 2024-03-28 MED ORDER — MIDAZOLAM HCL 2 MG/2ML IJ SOLN
INTRAMUSCULAR | Status: AC
  Filled 2024-03-28: qty 2

## 2024-03-28 MED ORDER — PROPOFOL 10 MG/ML IV BOLUS
INTRAVENOUS | Status: DC | PRN
  Administered 2024-03-28: 200 mg via INTRAVENOUS
  Administered 2024-03-28: 100 mg via INTRAVENOUS

## 2024-03-28 MED ORDER — LIDOCAINE HCL (PF) 1 % IJ SOLN
INTRAMUSCULAR | Status: DC | PRN
  Administered 2024-03-28: 10 mL

## 2024-03-28 MED ORDER — LIDOCAINE 2% (20 MG/ML) 5 ML SYRINGE
INTRAMUSCULAR | Status: AC
  Filled 2024-03-28: qty 5

## 2024-03-28 MED ORDER — SCOPOLAMINE 1 MG/3DAYS TD PT72
1.0000 | MEDICATED_PATCH | TRANSDERMAL | Status: DC
  Administered 2024-03-28: 1 mg via TRANSDERMAL

## 2024-03-28 MED ORDER — FENTANYL CITRATE (PF) 250 MCG/5ML IJ SOLN
INTRAMUSCULAR | Status: DC | PRN
  Administered 2024-03-28 (×2): 50 ug via INTRAVENOUS

## 2024-03-28 MED ORDER — SODIUM CHLORIDE 0.9 % IR SOLN
Status: DC | PRN
  Administered 2024-03-28: 2050 mL

## 2024-03-28 MED ORDER — ACETAMINOPHEN 500 MG PO TABS
1000.0000 mg | ORAL_TABLET | ORAL | Status: AC
  Administered 2024-03-28: 1000 mg via ORAL

## 2024-03-28 MED ORDER — ONDANSETRON HCL 4 MG/2ML IJ SOLN
INTRAMUSCULAR | Status: AC
  Filled 2024-03-28: qty 2

## 2024-03-28 MED ORDER — LIDOCAINE 2% (20 MG/ML) 5 ML SYRINGE
INTRAMUSCULAR | Status: DC | PRN
  Administered 2024-03-28: 60 mg via INTRAVENOUS

## 2024-03-28 NOTE — Transfer of Care (Signed)
 Immediate Anesthesia Transfer of Care Note  Patient: Phyllis Rogers  Procedure(s) Performed: HYSTEROSCOPY, DIAGNOSTIC (Uterus) REMOVAL, INTRAUTERINE DEVICE (Uterus)  Patient Location: PACU  Anesthesia Type:General  Level of Consciousness: drowsy  Airway & Oxygen Therapy: Patient Spontanous Breathing  Post-op Assessment: Report given to RN and Post -op Vital signs reviewed and stable  Post vital signs: Reviewed and stable  Last Vitals:  Vitals Value Taken Time  BP 124/72 03/28/24 13:23  Temp 36.4 C 03/28/24 13:23  Pulse 64 03/28/24 13:28  Resp 13 03/28/24 13:28  SpO2 98 % 03/28/24 13:28  Vitals shown include unfiled device data.  Last Pain:  Vitals:   03/28/24 1150  TempSrc: Oral  PainSc: 0-No pain      Patients Stated Pain Goal: 8 (03/28/24 1150)  Complications: No notable events documented.

## 2024-03-28 NOTE — Discharge Instructions (Signed)
" ° ° ° °  No acetaminophen /Tylenol  until after 5:53 pm today if needed.  No ibuprofen, Advil, Aleve, Motrin, ketorolac , meloxicam, naproxen, or other NSAIDS until after 5:53 pm today if needed. "

## 2024-03-28 NOTE — Anesthesia Postprocedure Evaluation (Signed)
"   Anesthesia Post Note  Patient: Dwayne Rakers  Procedure(s) Performed: HYSTEROSCOPY, DIAGNOSTIC (Uterus) REMOVAL, INTRAUTERINE DEVICE (Uterus)     Patient location during evaluation: PACU Anesthesia Type: General Level of consciousness: awake and alert Pain management: pain level controlled Vital Signs Assessment: post-procedure vital signs reviewed and stable Respiratory status: spontaneous breathing, nonlabored ventilation, respiratory function stable and patient connected to nasal cannula oxygen Cardiovascular status: blood pressure returned to baseline and stable Postop Assessment: no apparent nausea or vomiting Anesthetic complications: no   No notable events documented.  Last Vitals:  Vitals:   03/28/24 1345 03/28/24 1400  BP: (!) 122/59 120/64  Pulse: 66 68  Resp: 12 16  Temp:    SpO2: 100% 98%    Last Pain:  Vitals:   03/28/24 1400  TempSrc:   PainSc: 4                  Majesty Oehlert L Wendelin Reader      "

## 2024-03-28 NOTE — Anesthesia Preprocedure Evaluation (Addendum)
"                                    Anesthesia Evaluation  Patient identified by MRN, date of birth, ID band Patient awake    Reviewed: Allergy & Precautions, NPO status , Patient's Chart, lab work & pertinent test results  Airway Mallampati: II  TM Distance: >3 FB Neck ROM: Full    Dental no notable dental hx. (+) Teeth Intact, Dental Advisory Given   Pulmonary neg pulmonary ROS   Pulmonary exam normal breath sounds clear to auscultation       Cardiovascular hypertension, Pt. on medications Normal cardiovascular exam Rhythm:Regular Rate:Normal     Neuro/Psych negative neurological ROS  negative psych ROS   GI/Hepatic Neg liver ROS,GERD  Controlled,,  Endo/Other  negative endocrine ROS    Renal/GU negative Renal ROS  negative genitourinary   Musculoskeletal negative musculoskeletal ROS (+)    Abdominal   Peds  (+) ADHD Hematology negative hematology ROS (+)   Anesthesia Other Findings   Reproductive/Obstetrics                              Anesthesia Physical Anesthesia Plan  ASA: 2  Anesthesia Plan: General   Post-op Pain Management: Tylenol  PO (pre-op)*   Induction: Intravenous  PONV Risk Score and Plan: 3 and Ondansetron , Dexamethasone  and Midazolam   Airway Management Planned: LMA  Additional Equipment:   Intra-op Plan:   Post-operative Plan: Extubation in OR  Informed Consent: I have reviewed the patients History and Physical, chart, labs and discussed the procedure including the risks, benefits and alternatives for the proposed anesthesia with the patient or authorized representative who has indicated his/her understanding and acceptance.     Dental advisory given  Plan Discussed with: CRNA  Anesthesia Plan Comments:          Anesthesia Quick Evaluation  "

## 2024-03-28 NOTE — Anesthesia Procedure Notes (Signed)
 Procedure Name: LMA Insertion Date/Time: 03/28/2024 12:41 PM  Performed by: Hedy Jarred, CRNAPre-anesthesia Checklist: Patient identified, Emergency Drugs available, Suction available and Patient being monitored Patient Re-evaluated:Patient Re-evaluated prior to induction Oxygen Delivery Method: Circle System Utilized Preoxygenation: Pre-oxygenation with 100% oxygen Induction Type: IV induction Ventilation: Mask ventilation without difficulty LMA: LMA inserted LMA Size: 4.0 Number of attempts: 1 Airway Equipment and Method: Bite block Placement Confirmation: positive ETCO2 Tube secured with: Tape Dental Injury: Teeth and Oropharynx as per pre-operative assessment

## 2024-03-28 NOTE — Interval H&P Note (Signed)
 Date of Initial H&P: 03/16/24  History reviewed, patient examined, no change in status, stable for surgery.

## 2024-03-28 NOTE — Op Note (Signed)
 03/28/24  Phyllis Rogers [id number]  OPERATIVE REPORT  Preop Diagnosis: Pre-Op Diagnosis Codes:     * Attempted IUD removal, unsuccessful [Z30.432] IUD with lost strings  Post operative diagnosis: same  Procedure: Procedures: HYSTEROSCOPY, DIAGNOSTIC REMOVAL, INTRAUTERINE DEVICE   Surgeon: Vera LULLA Pa, MD Assistant: none  Anesthesia: MAC Fluids: please see anesthesia report Fluid deficit: see report  Complications: None  Findings: Normal cervix. Uterine cavity measuring 7cm with both ostia seen. Paraguard IUD visualized with tail curled toward left ostia.  Estimated blood loss: Minimal  Specimens: None  Disposition of specimen: Pathology    Procedure: Patient was taken to the OR where she was placed in dorsal lithotomy in stirrups. She voided prior to transport. SCDs were in place.  The patient was prepped and draped in the usual sterile fashion. An adequate timeout was obtained and everyone agreed. A bivalve speculum was placed inside the vagina and the cervix visualized. The cervix was grasped anteriorly with a single-tooth tenaculum. Paracervical block was performed with 1% lidocaine .  The uterus sounded to 7 cm. Sequential cervical dilation was done to 62fr, and the myosure hysteroscope was introduced into the uterine cavity.  Findings as noted. A hysteroscopic grasper was used to remove the IUD under direct visualization. It was removed intact and dsicarded. All instrument, sponge and lap counts were correct x2. The patient was awakened taken to recovery room in stable condition.  Vera LULLA Pa, MD 03/28/24 1:45 PM

## 2024-03-29 ENCOUNTER — Encounter (HOSPITAL_COMMUNITY): Payer: Self-pay | Admitting: Obstetrics and Gynecology

## 2024-04-02 ENCOUNTER — Ambulatory Visit: Admitting: Family Medicine

## 2024-04-05 ENCOUNTER — Encounter: Admitting: Obstetrics and Gynecology

## 2024-04-12 ENCOUNTER — Ambulatory Visit: Admitting: Sports Medicine
